# Patient Record
Sex: Female | Born: 1995
Health system: Southern US, Community
[De-identification: ages and names within clinical notes are randomized; demographics above are authoritative.]

## PROBLEM LIST (undated history)

## (undated) DIAGNOSIS — F419 Anxiety disorder, unspecified: Secondary | ICD-10-CM

## (undated) DIAGNOSIS — T7840XA Allergy, unspecified, initial encounter: Secondary | ICD-10-CM

## (undated) HISTORY — DX: Allergy, unspecified, initial encounter: T78.40XA

## (undated) HISTORY — DX: Anxiety disorder, unspecified: F41.9

---
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
## 2017-01-18 DIAGNOSIS — Z0000 Encounter for general adult medical examination without abnormal findings: Secondary | ICD-10-CM | POA: Diagnosis not present

## 2017-01-18 DIAGNOSIS — Z872 Personal history of diseases of the skin and subcutaneous tissue: Secondary | ICD-10-CM | POA: Diagnosis not present

## 2017-01-18 DIAGNOSIS — Z23 Encounter for immunization: Secondary | ICD-10-CM | POA: Diagnosis not present

## 2017-01-29 DIAGNOSIS — N390 Urinary tract infection, site not specified: Secondary | ICD-10-CM | POA: Diagnosis not present

## 2018-02-06 ENCOUNTER — Encounter: Payer: Self-pay | Admitting: Family Medicine

## 2018-02-06 ENCOUNTER — Encounter (INDEPENDENT_AMBULATORY_CARE_PROVIDER_SITE_OTHER): Payer: Self-pay

## 2018-02-06 ENCOUNTER — Ambulatory Visit (INDEPENDENT_AMBULATORY_CARE_PROVIDER_SITE_OTHER): Payer: 59 | Admitting: Family Medicine

## 2018-02-06 VITALS — BP 112/74 | HR 64 | Temp 98.6°F | Ht 63.75 in | Wt 126.5 lb

## 2018-02-06 DIAGNOSIS — Z23 Encounter for immunization: Secondary | ICD-10-CM

## 2018-02-06 DIAGNOSIS — Z0000 Encounter for general adult medical examination without abnormal findings: Secondary | ICD-10-CM

## 2018-02-06 DIAGNOSIS — N926 Irregular menstruation, unspecified: Secondary | ICD-10-CM | POA: Diagnosis not present

## 2018-02-06 DIAGNOSIS — Z113 Encounter for screening for infections with a predominantly sexual mode of transmission: Secondary | ICD-10-CM

## 2018-02-06 LAB — POCT URINE PREGNANCY: PREG TEST UR: NEGATIVE

## 2018-02-06 NOTE — Patient Instructions (Signed)

## 2018-02-06 NOTE — Progress Notes (Signed)
Subjective:    Patient ID: Nancy FellersCharlotte Louque, female    DOB: [birth year], 22 y.o.   MRN: [id number]  HPI This is a 22 yo female who presents today to establish care. Previously seen at [facility]. 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 She enjoys dancing, hanging out with friends.   Last CPE 01/18/17. Tdap- UTD Flu- most years Eye- unsure Dental- regular Exercise- regular Sleep- good, feels rested Stress- high during school year, stress relief- socializing, music Sexual- has had two partners, using condoms, has never had testing for STI Menses- irregular, 5-6 days, cramping on day 1, heavy bleeding 1-2 days    History reviewed. No pertinent past medical history. Past Surgical History:  Procedure Laterality Date  . WISDOM TOOTH EXTRACTION  2016   Family History  Problem Relation Age of Onset  . Sudden death Neg Hx   . Hypertension Neg Hx   . Hyperlipidemia Neg Hx   . Heart attack Neg Hx   . Diabetes Neg Hx    Social History   Tobacco Use  . Smoking status: Never Smoker  . Smokeless tobacco: Never Used  Substance Use Topics  . Alcohol use: Yes    Comment: Occ  . Drug use: Never     Review of Systems  Constitutional: Negative.   HENT: Negative.   Eyes: Negative.   Respiratory: Negative.   Cardiovascular: Negative.   Gastrointestinal: Negative.   Endocrine: Negative.   Genitourinary: Positive for menstrual problem (irregular).  Musculoskeletal: Negative.   Skin: Negative.   Allergic/Immunologic: Negative.   Neurological: Negative.   Hematological: Negative.        Objective:   Physical Exam Physical Exam  Constitutional: She is oriented to person, place, and time. She appears well-developed and well-nourished. No distress.  HENT:  Head: Normocephalic and atraumatic.  Right Ear: External ear normal.  Left Ear: External ear normal.  Nose: Nose normal.  Mouth/Throat: Oropharynx is clear and moist. No  oropharyngeal exudate.  Eyes: Conjunctivae are normal. Pupils are equal, round, and reactive to light.  Neck: Normal range of motion. Neck supple. No JVD present. No thyromegaly present.  Cardiovascular: Normal rate, regular rhythm, normal heart sounds and intact distal pulses.   Pulmonary/Chest: Effort normal and breath sounds normal. Musculoskeletal: Normal range of motion. She exhibits no edema or tenderness.  Lymphadenopathy:    She has no cervical adenopathy.  Neurological: She is alert and oriented to person, place, and time. She has normal reflexes.  Skin: Skin is warm and dry. She is not diaphoretic.  Psychiatric: She has a normal mood and affect. Her behavior is normal. Judgment and thought content normal.  Vitals reviewed.     BP 112/74 (BP Location: Right Arm, Patient Position: Sitting, Cuff Size: Normal)   Pulse 64   Temp 98.6 F (37 C) (Oral)   Ht 5' 3.75" (1.619 m)   Wt 126 lb 8 oz (57.4 kg)   SpO2 98%   BMI 21.88 kg/m  Wt Readings from Last 3 Encounters:  02/06/18 126 lb 8 oz (57.4 kg) (48 %, Z= -0.06)*  10/02/13 115 lb (52.2 kg) (48 %, Z= -0.04)*   * Growth percentiles are based on CDC (Girls, 2-20 Years) data.       Assessment & Plan:  1. Annual physical exam - Discussed and encouraged healthy lifestyle choices- adequate sleep, regular exercise, stress management and healthy food choices.   2. Routine screening for STI (sexually transmitted  infection) - discussed contraception options, encouraged regular condom use - HIV antibody - RPR - C. trachomatis/N. gonorrhoeae RNA  3. Need for meningococcal vaccination - reviewed NCIR, only need is for Meningitis B - Meningococcal B, OMV (Bexsero)  4. Late period - Irregular periods, negative pregnancy test confirmed before immunization given - POCT urine pregnancy   Olean Ree, FNP-BC  [facility], [facility]  02/06/2018 10:38 PM

## 2018-02-07 LAB — C. TRACHOMATIS/N. GONORRHOEAE RNA
C. trachomatis RNA, TMA: NOT DETECTED
N. gonorrhoeae RNA, TMA: NOT DETECTED

## 2018-02-07 LAB — RPR: RPR Ser Ql: NONREACTIVE

## 2018-02-07 LAB — HIV ANTIBODY (ROUTINE TESTING W REFLEX): HIV: NONREACTIVE

## 2018-03-09 ENCOUNTER — Ambulatory Visit: Payer: 59

## 2018-04-01 ENCOUNTER — Encounter: Payer: Self-pay | Admitting: Family Medicine

## 2018-04-03 ENCOUNTER — Encounter: Payer: Self-pay | Admitting: Family Medicine

## 2018-04-03 ENCOUNTER — Other Ambulatory Visit: Payer: Self-pay | Admitting: Family Medicine

## 2018-04-05 ENCOUNTER — Other Ambulatory Visit: Payer: Self-pay | Admitting: Family Medicine

## 2018-04-05 DIAGNOSIS — N938 Other specified abnormal uterine and vaginal bleeding: Secondary | ICD-10-CM

## 2018-04-05 MED ORDER — NORGESTIMATE-ETH ESTRADIOL 0.25-35 MG-MCG PO TABS: 1.0000 | ORAL_TABLET | Freq: Every day | ORAL | 4 refills | Status: DC

## 2018-05-29 DIAGNOSIS — Z23 Encounter for immunization: Secondary | ICD-10-CM | POA: Diagnosis not present

## 2018-05-31 DIAGNOSIS — H5712 Ocular pain, left eye: Secondary | ICD-10-CM | POA: Diagnosis not present

## 2018-05-31 DIAGNOSIS — H5789 Other specified disorders of eye and adnexa: Secondary | ICD-10-CM | POA: Diagnosis not present

## 2018-05-31 DIAGNOSIS — H579 Unspecified disorder of eye and adnexa: Secondary | ICD-10-CM | POA: Diagnosis not present

## 2019-04-10 ENCOUNTER — Other Ambulatory Visit: Payer: Self-pay | Admitting: Family Medicine

## 2019-04-10 DIAGNOSIS — N938 Other specified abnormal uterine and vaginal bleeding: Secondary | ICD-10-CM

## 2019-04-12 ENCOUNTER — Encounter: Payer: Self-pay | Admitting: Family Medicine

## 2019-05-06 ENCOUNTER — Other Ambulatory Visit: Payer: Self-pay | Admitting: Family Medicine

## 2019-05-06 DIAGNOSIS — N938 Other specified abnormal uterine and vaginal bleeding: Secondary | ICD-10-CM

## 2019-05-11 ENCOUNTER — Other Ambulatory Visit: Payer: Self-pay

## 2019-05-11 ENCOUNTER — Ambulatory Visit (INDEPENDENT_AMBULATORY_CARE_PROVIDER_SITE_OTHER): Payer: 59 | Admitting: Family Medicine

## 2019-05-11 ENCOUNTER — Encounter: Payer: Self-pay | Admitting: Family Medicine

## 2019-05-11 VITALS — BP 102/62 | HR 99 | Temp 98.0°F | Ht 63.75 in | Wt 134.0 lb

## 2019-05-11 DIAGNOSIS — Z0000 Encounter for general adult medical examination without abnormal findings: Secondary | ICD-10-CM | POA: Diagnosis not present

## 2019-05-11 DIAGNOSIS — Z3041 Encounter for surveillance of contraceptive pills: Secondary | ICD-10-CM | POA: Diagnosis not present

## 2019-05-11 DIAGNOSIS — Z23 Encounter for immunization: Secondary | ICD-10-CM

## 2019-05-11 MED ORDER — NORGESTIMATE-ETH ESTRADIOL 0.25-35 MG-MCG PO TABS: 1.0000 | ORAL_TABLET | Freq: Every day | ORAL | 4 refills | Status: DC

## 2019-05-11 NOTE — Progress Notes (Signed)
Subjective:    Patient ID: Nancy Fleming, female    DOB: 07-Jan-1996, 23 y.o.   MRN: [id number]  HPI Chief Complaint  Patient presents with  . Follow-up    Refill of birth control   This is a 23 yo female who presents today for annual visit and for refill of OCPs. She has been doing well. Living in [place] and doing online classes at [employer]. Same boyfriend. Supportive roommate.   Last CPE- 02/06/2018 Pap- due at age 53 Tdap- prior to starting college Flu- annual Eye- no Dental- regular Exercise- has decreased due to pandemic, some You tube exercise videos     History reviewed. No pertinent past medical history. Past Surgical History:  Procedure Laterality Date  . WISDOM TOOTH EXTRACTION  2016   Family History  Problem Relation Age of Onset  . Sudden death Neg Hx   . Hypertension Neg Hx   . Hyperlipidemia Neg Hx   . Heart attack Neg Hx   . Diabetes Neg Hx    Social History   Tobacco Use  . Smoking status: Never Smoker  . Smokeless tobacco: Never Used  Substance Use Topics  . Alcohol use: Yes    Comment: Occ  . Drug use: Never      Review of Systems  Constitutional: Negative.   HENT: Negative.   Eyes: Negative.   Respiratory: Negative.   Cardiovascular: Negative.   Gastrointestinal: Negative.   Endocrine: Negative.   Genitourinary: Negative.   Musculoskeletal: Negative.   Skin: Negative.   Allergic/Immunologic: Negative.   Neurological: Negative.   Hematological: Negative.   Psychiatric/Behavioral: Negative.        Objective:   Physical Exam Vitals signs reviewed.  Constitutional:      General: She is not in acute distress.    Appearance: Normal appearance. She is normal weight. She is not ill-appearing, toxic-appearing or diaphoretic.  HENT:     Head: Normocephalic and atraumatic.     Right Ear: Tympanic membrane, ear canal and external ear normal.     Left Ear: Tympanic membrane, ear canal and external ear normal.     Nose: Nose normal.     Mouth/Throat:     Mouth: Mucous membranes are moist.     Pharynx: Oropharynx is clear.  Eyes:     Conjunctiva/sclera: Conjunctivae normal.  Neck:     Musculoskeletal: Normal range of motion and neck supple.  Cardiovascular:     Rate and Rhythm: Normal rate and regular rhythm.     Heart sounds: Normal heart sounds.  Pulmonary:     Effort: Pulmonary effort is normal.     Breath sounds: Normal breath sounds.  Musculoskeletal: Normal range of motion.     Right lower leg: No edema.     Left lower leg: No edema.  Skin:    General: Skin is warm and dry.  Neurological:     Mental Status: She is alert.  Psychiatric:        Mood and Affect: Mood normal.        Behavior: Behavior normal.        Thought Content: Thought content normal.        Judgment: Judgment normal.       BP 102/62 (BP Location: Left Arm, Patient Position: Sitting, Cuff Size: Normal)   Pulse 99   Temp 98 F (36.7 C) (Temporal)   Ht 5' 3.75" (1.619 m)   Wt 134 lb (60.8 kg)   SpO2 99%   BMI 23.18  kg/m  Wt Readings from Last 3 Encounters:  05/11/19 134 lb (60.8 kg)  02/06/18 126 lb 8 oz (57.4 kg) (48 %, Z= -0.06)*  10/02/13 115 lb (52.2 kg) (48 %, Z= -0.04)*   * Growth percentiles are based on CDC (Girls, 2-20 Years) data.       Assessment & Plan:  1. Well woman exam without gynecological exam - Discussed and encouraged healthy lifestyle choices- adequate sleep, regular exercise, stress management and healthy food choices.   2. Encounter for surveillance of contraceptive pills - norgestimate-ethinyl estradiol (SPRINTEC 28) 0.25-35 MG-MCG tablet; Take 1 tablet by mouth daily.  Dispense: 3 Package; Refill: 4  3. Need for influenza vaccination - Flu Vaccine QUAD 6+ mos PF IM (Fluarix Quad PF)   Olean Ree, FNP-BC  [facility], [facility]  05/11/2019 4:53 PM

## 2019-05-11 NOTE — Progress Notes (Signed)
Subjective:    Patient ID: Nancy Fleming, female    DOB: 1995/08/14, 23 y.o.   MRN: [id number]  Chief Complaint  Patient presents with  . Follow-up    Refill of birth control    HPI This is 23 year old white female comes for her annual physical. She continues to take classes on line. She occasionally does exercises at home. Pt is with the same partner as last year. She does not express concerns for STDs testing  History reviewed. No pertinent past medical history.  Past Surgical History:  Procedure Laterality Date  . WISDOM TOOTH EXTRACTION  2016   Family History  Problem Relation Age of Onset  . Sudden death Neg Hx   . Hypertension Neg Hx   . Hyperlipidemia Neg Hx   . Heart attack Neg Hx   . Diabetes Neg Hx      Review of Systems  Constitutional: Negative for activity change, fatigue and fever.  HENT: Negative for congestion, ear pain, facial swelling, mouth sores, rhinorrhea, sinus pressure and sore throat.   Eyes: Negative for pain.  Respiratory: Negative for cough, chest tightness, shortness of breath and wheezing.   Cardiovascular: Negative for chest pain, palpitations and leg swelling.  Gastrointestinal: Negative for abdominal distention, abdominal pain, blood in stool, constipation, diarrhea, nausea and vomiting.  Genitourinary: Negative for difficulty urinating, flank pain, frequency and urgency.  Musculoskeletal: Negative.   Skin: Negative.   Neurological: Negative for dizziness, syncope, weakness, light-headedness, numbness and headaches.  Hematological: Negative for adenopathy.       Objective:   Physical Exam Vitals signs and nursing note reviewed.  Constitutional:      General: She is not in acute distress.    Appearance: Normal appearance. She is normal weight. She is not ill-appearing.  HENT:     Head: Normocephalic and atraumatic.     Right Ear: Tympanic membrane, ear canal and external ear normal. There is no impacted cerumen.     Left Ear:  Tympanic membrane, ear canal and external ear normal. There is no impacted cerumen.     Nose: No congestion or rhinorrhea.     Mouth/Throat:     Mouth: Mucous membranes are moist.     Pharynx: Oropharynx is clear. No oropharyngeal exudate or posterior oropharyngeal erythema.  Eyes:     General: No scleral icterus.       Right eye: No discharge.        Left eye: No discharge.     Extraocular Movements: Extraocular movements intact.     Conjunctiva/sclera: Conjunctivae normal.     Pupils: Pupils are equal, round, and reactive to light.  Neck:     Musculoskeletal: Normal range of motion and neck supple.  Cardiovascular:     Rate and Rhythm: Normal rate and regular rhythm.     Pulses: Normal pulses.     Heart sounds: Normal heart sounds.  Pulmonary:     Effort: Pulmonary effort is normal.     Breath sounds: Normal breath sounds. No wheezing, rhonchi or rales.  Abdominal:     General: Bowel sounds are normal. There is no distension.     Palpations: Abdomen is soft.     Tenderness: There is no abdominal tenderness.  Musculoskeletal:     Right lower leg: No edema.     Left lower leg: No edema.  Skin:    General: Skin is warm and dry.     Findings: No rash.     Comments: Hx  of eczema  Neurological:     General: No focal deficit present.     Mental Status: She is alert and oriented to person, place, and time. Mental status is at baseline.     Motor: No weakness.  Psychiatric:        Mood and Affect: Mood normal.        Behavior: Behavior normal.        Thought Content: Thought content normal.        Judgment: Judgment normal.        Assessment & Plan:  1. Dysfunctional uterine bleeding Stable condition, continue with current prescription  - norgestimate-ethinyl estradiol (SPRINTEC 28) 0.25-35 MG-MCG tablet; Take 1 tablet by mouth daily.  Dispense: 3 Package; Refill: 4

## 2019-05-11 NOTE — Patient Instructions (Signed)
Health Maintenance, Female Adopting a healthy lifestyle and getting preventive care are important in promoting health and wellness. Ask your health care provider about:  The right schedule for you to have regular tests and exams.  Things you can do on your own to prevent diseases and keep yourself healthy. What should I know about diet, weight, and exercise? Eat a healthy diet   Eat a diet that includes plenty of vegetables, fruits, low-fat dairy products, and lean protein.  Do not eat a lot of foods that are high in solid fats, added sugars, or sodium. Maintain a healthy weight Body mass index (BMI) is used to identify weight problems. It estimates body fat based on height and weight. Your health care provider can help determine your BMI and help you achieve or maintain a healthy weight. Get regular exercise Get regular exercise. This is one of the most important things you can do for your health. Most adults should:  Exercise for at least 150 minutes each week. The exercise should increase your heart rate and make you sweat (moderate-intensity exercise).  Do strengthening exercises at least twice a week. This is in addition to the moderate-intensity exercise.  Spend less time sitting. Even light physical activity can be beneficial. Watch cholesterol and blood lipids Have your blood tested for lipids and cholesterol at 20 years of age, then have this test every 5 years. Have your cholesterol levels checked more often if:  Your lipid or cholesterol levels are high.  You are older than 21 years of age.  You are at high risk for heart disease. What should I know about cancer screening? Depending on your health history and family history, you may need to have cancer screening at various ages. This may include screening for:  Breast cancer.  Cervical cancer.  Colorectal cancer.  Skin cancer.  Lung cancer. What should I know about heart disease, diabetes, and high blood  pressure? Blood pressure and heart disease  High blood pressure causes heart disease and increases the risk of stroke. This is more likely to develop in people who have high blood pressure readings, are of African descent, or are overweight.  Have your blood pressure checked: ? Every 3-5 years if you are 18-39 years of age. ? Every year if you are 40 years old or older. Diabetes Have regular diabetes screenings. This checks your fasting blood sugar level. Have the screening done:  Once every three years after age 40 if you are at a normal weight and have a low risk for diabetes.  More often and at a younger age if you are overweight or have a high risk for diabetes. What should I know about preventing infection? Hepatitis B If you have a higher risk for hepatitis B, you should be screened for this virus. Talk with your health care provider to find out if you are at risk for hepatitis B infection. Hepatitis C Testing is recommended for:  Everyone born from 1945 through 1965.  Anyone with known risk factors for hepatitis C. Sexually transmitted infections (STIs)  Get screened for STIs, including gonorrhea and chlamydia, if: ? You are sexually active and are younger than 21 years of age. ? You are older than 21 years of age and your health care provider tells you that you are at risk for this type of infection. ? Your sexual activity has changed since you were last screened, and you are at increased risk for chlamydia or gonorrhea. Ask your health care provider if   you are at risk.  Ask your health care provider about whether you are at high risk for HIV. Your health care provider may recommend a prescription medicine to help prevent HIV infection. If you choose to take medicine to prevent HIV, you should first get tested for HIV. You should then be tested every 3 months for as long as you are taking the medicine. Pregnancy  If you are about to stop having your period (premenopausal) and  you may become pregnant, seek counseling before you get pregnant.  Take 400 to 800 micrograms (mcg) of folic acid every day if you become pregnant.  Ask for birth control (contraception) if you want to prevent pregnancy. Osteoporosis and menopause Osteoporosis is a disease in which the bones lose minerals and strength with aging. This can result in bone fractures. If you are 65 years old or older, or if you are at risk for osteoporosis and fractures, ask your health care provider if you should:  Be screened for bone loss.  Take a calcium or vitamin D supplement to lower your risk of fractures.  Be given hormone replacement therapy (HRT) to treat symptoms of menopause. Follow these instructions at home: Lifestyle  Do not use any products that contain nicotine or tobacco, such as cigarettes, e-cigarettes, and chewing tobacco. If you need help quitting, ask your health care provider.  Do not use street drugs.  Do not share needles.  Ask your health care provider for help if you need support or information about quitting drugs. Alcohol use  Do not drink alcohol if: ? Your health care provider tells you not to drink. ? You are pregnant, may be pregnant, or are planning to become pregnant.  If you drink alcohol: ? Limit how much you use to 0-1 drink a day. ? Limit intake if you are breastfeeding.  Be aware of how much alcohol is in your drink. In the [place] one drink equals one 12 oz bottle of beer (355 mL), one 5 oz glass of wine (148 mL), or one 1 oz glass of hard liquor (44 mL). General instructions  Schedule regular health, dental, and eye exams.  Stay current with your vaccines.  Tell your health care provider if: ? You often feel depressed. ? You have ever been abused or do not feel safe at home. Summary  Adopting a healthy lifestyle and getting preventive care are important in promoting health and wellness.  Follow your health care provider's instructions about healthy  diet, exercising, and getting tested or screened for diseases.  Follow your health care provider's instructions on monitoring your cholesterol and blood pressure. This information is not intended to replace advice given to you by your health care provider. Make sure you discuss any questions you have with your health care provider. Document Released: 01/25/2011 Document Revised: 07/05/2018 Document Reviewed: 07/05/2018 Elsevier Patient Education  2020 [employer]  

## 2020-05-14 ENCOUNTER — Encounter: Payer: 59 | Admitting: Family Medicine

## 2020-06-09 ENCOUNTER — Other Ambulatory Visit (HOSPITAL_COMMUNITY)
Admission: RE | Admit: 2020-06-09 | Discharge: 2020-06-09 | Disposition: A | Discharge status: Home / Self Care | Payer: 59 | Source: Ambulatory Visit | Attending: Family Medicine | Admitting: Family Medicine

## 2020-06-09 ENCOUNTER — Ambulatory Visit (INDEPENDENT_AMBULATORY_CARE_PROVIDER_SITE_OTHER): Payer: 59 | Admitting: Family Medicine

## 2020-06-09 ENCOUNTER — Encounter: Payer: Self-pay | Admitting: Family Medicine

## 2020-06-09 ENCOUNTER — Other Ambulatory Visit: Payer: Self-pay

## 2020-06-09 VITALS — BP 110/64 | HR 80 | Temp 97.7°F | Ht 63.5 in | Wt 128.0 lb

## 2020-06-09 DIAGNOSIS — Z23 Encounter for immunization: Secondary | ICD-10-CM | POA: Diagnosis not present

## 2020-06-09 DIAGNOSIS — Z124 Encounter for screening for malignant neoplasm of cervix: Secondary | ICD-10-CM

## 2020-06-09 DIAGNOSIS — Z0000 Encounter for general adult medical examination without abnormal findings: Secondary | ICD-10-CM

## 2020-06-09 DIAGNOSIS — Z3041 Encounter for surveillance of contraceptive pills: Secondary | ICD-10-CM | POA: Diagnosis not present

## 2020-06-09 MED ORDER — NORGESTIMATE-ETH ESTRADIOL 0.25-35 MG-MCG PO TABS: 1.0000 | ORAL_TABLET | Freq: Every day | ORAL | 4 refills | Status: DC

## 2020-06-09 NOTE — Patient Instructions (Addendum)
Good to see you today, good luck with finishing up school!   Health Maintenance, Female Adopting a healthy lifestyle and getting preventive care are important in promoting health and wellness. Ask your health care provider about:  The right schedule for you to have regular tests and exams.  Things you can do on your own to prevent diseases and keep yourself healthy. What should I know about diet, weight, and exercise? Eat a healthy diet   Eat a diet that includes plenty of vegetables, fruits, low-fat dairy products, and lean protein.  Do not eat a lot of foods that are high in solid fats, added sugars, or sodium. Maintain a healthy weight Body mass index (BMI) is used to identify weight problems. It estimates body fat based on height and weight. Your health care provider can help determine your BMI and help you achieve or maintain a healthy weight. Get regular exercise Get regular exercise. This is one of the most important things you can do for your health. Most adults should:  Exercise for at least 150 minutes each week. The exercise should increase your heart rate and make you sweat (moderate-intensity exercise).  Do strengthening exercises at least twice a week. This is in addition to the moderate-intensity exercise.  Spend less time sitting. Even light physical activity can be beneficial. Watch cholesterol and blood lipids Have your blood tested for lipids and cholesterol at 22 years of age, then have this test every 5 years. Have your cholesterol levels checked more often if:  Your lipid or cholesterol levels are high.  You are older than 22 years of age.  You are at high risk for heart disease. What should I know about cancer screening? Depending on your health history and family history, you may need to have cancer screening at various ages. This may include screening for:  Breast cancer.  Cervical cancer.  Colorectal cancer.  Skin cancer.  Lung cancer. What  should I know about heart disease, diabetes, and high blood pressure? Blood pressure and heart disease  High blood pressure causes heart disease and increases the risk of stroke. This is more likely to develop in people who have high blood pressure readings, are of African descent, or are overweight.  Have your blood pressure checked: ? Every 3-5 years if you are 42-13 years of age. ? Every year if you are 41 years old or older. Diabetes Have regular diabetes screenings. This checks your fasting blood sugar level. Have the screening done:  Once every three years after age 83 if you are at a normal weight and have a low risk for diabetes.  More often and at a younger age if you are overweight or have a high risk for diabetes. What should I know about preventing infection? Hepatitis B If you have a higher risk for hepatitis B, you should be screened for this virus. Talk with your health care provider to find out if you are at risk for hepatitis B infection. Hepatitis C Testing is recommended for:  Everyone born from [birth year] through 1963.  Anyone with known risk factors for hepatitis C. Sexually transmitted infections (STIs)  Get screened for STIs, including gonorrhea and chlamydia, if: ? You are sexually active and are younger than 22 years of age. ? You are older than 22 years of age and your health care provider tells you that you are at risk for this type of infection. ? Your sexual activity has changed since you were last screened, and you are  at increased risk for chlamydia or gonorrhea. Ask your health care provider if you are at risk.  Ask your health care provider about whether you are at high risk for HIV. Your health care provider may recommend a prescription medicine to help prevent HIV infection. If you choose to take medicine to prevent HIV, you should first get tested for HIV. You should then be tested every 3 months for as long as you are taking the medicine. Pregnancy  If  you are about to stop having your period (premenopausal) and you may become pregnant, seek counseling before you get pregnant.  Take 400 to 800 micrograms (mcg) of folic acid every day if you become pregnant.  Ask for birth control (contraception) if you want to prevent pregnancy. Osteoporosis and menopause Osteoporosis is a disease in which the bones lose minerals and strength with aging. This can result in bone fractures. If you are 66 years old or older, or if you are at risk for osteoporosis and fractures, ask your health care provider if you should:  Be screened for bone loss.  Take a calcium or vitamin D supplement to lower your risk of fractures.  Be given hormone replacement therapy (HRT) to treat symptoms of menopause. Follow these instructions at home: Lifestyle  Do not use any products that contain nicotine or tobacco, such as cigarettes, e-cigarettes, and chewing tobacco. If you need help quitting, ask your health care provider.  Do not use street drugs.  Do not share needles.  Ask your health care provider for help if you need support or information about quitting drugs. Alcohol use  Do not drink alcohol if: ? Your health care provider tells you not to drink. ? You are pregnant, may be pregnant, or are planning to become pregnant.  If you drink alcohol: ? Limit how much you use to 0-1 drink a day. ? Limit intake if you are breastfeeding.  Be aware of how much alcohol is in your drink. In the [place] one drink equals one 12 oz bottle of beer (355 mL), one 5 oz glass of wine (148 mL), or one 1 oz glass of hard liquor (44 mL). General instructions  Schedule regular health, dental, and eye exams.  Stay current with your vaccines.  Tell your health care provider if: ? You often feel depressed. ? You have ever been abused or do not feel safe at home. Summary  Adopting a healthy lifestyle and getting preventive care are important in promoting health and  wellness.  Follow your health care provider's instructions about healthy diet, exercising, and getting tested or screened for diseases.  Follow your health care provider's instructions on monitoring your cholesterol and blood pressure. This information is not intended to replace advice given to you by your health care provider. Make sure you discuss any questions you have with your health care provider. Document Revised: 07/05/2018 Document Reviewed: 07/05/2018 Elsevier Patient Education  2020 [employer].    https://www.cdc.gov/vaccines/hcp/vis/vis-statements/tdap.pdf">  Tdap (Tetanus, Diphtheria, Pertussis) Vaccine: What You Need to Know 1. Why get vaccinated? Tdap vaccine can prevent tetanus, diphtheria, and pertussis. Diphtheria and pertussis spread from person to person. Tetanus enters the body through cuts or wounds.  TETANUS (T) causes painful stiffening of the muscles. Tetanus can lead to serious health problems, including being unable to open the mouth, having trouble swallowing and breathing, or death.  DIPHTHERIA (D) can lead to difficulty breathing, heart failure, paralysis, or death.  PERTUSSIS (aP), also known as "whooping cough," can cause  uncontrollable, violent coughing which makes it hard to breathe, eat, or drink. Pertussis can be extremely serious in babies and young children, causing pneumonia, convulsions, brain damage, or death. In teens and adults, it can cause weight loss, loss of bladder control, passing out, and rib fractures from severe coughing. 2. Tdap vaccine Tdap is only for children 7 years and older, adolescents, and adults.  Adolescents should receive a single dose of Tdap, preferably at age 22 or 12 years. Pregnant women should get a dose of Tdap during every pregnancy, to protect the newborn from pertussis. Infants are most at risk for severe, life-threatening complications from pertussis. Adults who have never received Tdap should get a dose of  Tdap. Also, adults should receive a booster dose every 10 years, or earlier in the case of a severe and dirty wound or burn. Booster doses can be either Tdap or Td (a different vaccine that protects against tetanus and diphtheria but not pertussis). Tdap may be given at the same time as other vaccines. 3. Talk with your health care provider Tell your vaccine provider if the person getting the vaccine:  Has had an allergic reaction after a previous dose of any vaccine that protects against tetanus, diphtheria, or pertussis, or has any severe, life-threatening allergies.  Has had a coma, decreased level of consciousness, or prolonged seizures within 7 days after a previous dose of any pertussis vaccine (DTP, DTaP, or Tdap).  Has seizures or another nervous system problem.  Has ever had Guillain-Barr Syndrome (also called GBS).  Has had severe pain or swelling after a previous dose of any vaccine that protects against tetanus or diphtheria. In some cases, your health care provider may decide to postpone Tdap vaccination to a future visit.  People with minor illnesses, such as a cold, may be vaccinated. People who are moderately or severely ill should usually wait until they recover before getting Tdap vaccine.  Your health care provider can give you more information. 4. Risks of a vaccine reaction  Pain, redness, or swelling where the shot was given, mild fever, headache, feeling tired, and nausea, vomiting, diarrhea, or stomachache sometimes happen after Tdap vaccine. People sometimes faint after medical procedures, including vaccination. Tell your provider if you feel dizzy or have vision changes or ringing in the ears.  As with any medicine, there is a very remote chance of a vaccine causing a severe allergic reaction, other serious injury, or death. 5. What if there is a serious problem? An allergic reaction could occur after the vaccinated person leaves the clinic. If you see signs of a  severe allergic reaction (hives, swelling of the face and throat, difficulty breathing, a fast heartbeat, dizziness, or weakness), call 9-1-1 and get the person to the nearest hospital. For other signs that concern you, call your health care provider.  Adverse reactions should be reported to the Vaccine Adverse Event Reporting System (VAERS). Your health care provider will usually file this report, or you can do it yourself. Visit the VAERS website at www.vaers.LAgents.nohhs.gov or call (339) 156-53191-613-474-5512. VAERS is only for reporting reactions, and VAERS staff do not give medical advice. 6. The National Vaccine Injury Compensation Program The [employer] Vaccine Injury Compensation Program (VICP) is a federal program that was created to compensate people who may have been injured by certain vaccines. Visit the VICP website at SpiritualWord.atwww.hrsa.gov/vaccinecompensation or call (458) 525-44821-321-843-6535 to learn about the program and about filing a claim. There is a time limit to file a claim for compensation. 7.  How can I learn more?  Ask your health care provider.  Call your local or state health department.  Contact the Centers for Disease Control and Prevention (CDC): ? Call 904 333 7558 (1-800-CDC-INFO) or ? Visit CDC's website at PicCapture.uy Vaccine Information Statement Tdap (Tetanus, Diphtheria, Pertussis) Vaccine (10/25/2018) This information is not intended to replace advice given to you by your health care provider. Make sure you discuss any questions you have with your health care provider. Document Revised: 11/03/2018 Document Reviewed: 11/06/2018 Elsevier Patient Education  2020 [employer].

## 2020-06-09 NOTE — Progress Notes (Signed)
Subjective:    Patient ID: Nancy Fleming, female    DOB: [birth year], 24 y.o.   MRN: [id number]  HPI Chief Complaint  Patient presents with  . Annual Exam   She is a Holiday representative at [employer], Airline pilot.  She is enjoying it but it is stressful.  She will graduate in May.  Last CPE- last year Pap-has turned 23 in the last year, will have her Pap today Tdap-overdue, will have today Flu-has already had Covid 19 vaccine- fully vaccinated Dental-regular Exercise-has been going to the gym several times a week Sexual- still with same boyfriend, monogamous    Review of Systems  Constitutional: Negative.   HENT: Negative.   Eyes: Negative.   Respiratory: Negative.   Cardiovascular: Negative.   Gastrointestinal: Negative.   Endocrine: Negative.   Genitourinary: Negative.   Musculoskeletal: Negative.   Skin: Negative.   Allergic/Immunologic: Negative.   Neurological: Negative.   Hematological: Negative.   Psychiatric/Behavioral: Negative.        Objective:   Physical Exam Physical Exam  Constitutional: She is oriented to person, place, and time. She appears well-developed and well-nourished. No distress.  HENT:  Head: Normocephalic and atraumatic.  Right Ear: External ear normal. TM normal.  Left Ear: External ear normal. TM normal.  Nose: Nose normal.  Mouth/Throat: Oropharynx is clear and moist. No oropharyngeal exudate.  Eyes: Conjunctivae are normal.   Neck: Normal range of motion. Neck supple. No JVD present. No thyromegaly present.  Cardiovascular: Normal rate, regular rhythm, normal heart sounds and intact distal pulses.   Pulmonary/Chest: Effort normal and breath sounds normal. Right breast exhibits no inverted nipple, no mass, no nipple discharge, no skin change and no tenderness. Left breast exhibits no inverted nipple, no mass, no nipple discharge, no skin change and no tenderness. Breasts are symmetrical.  Abdominal: Soft. Bowel sounds are normal. She exhibits  no distension and no mass. There is no tenderness. There is no rebound and no guarding.  Genitourinary: Vagina normal. Pelvic exam was performed with patient supine. There is no rash, tenderness, lesion or injury on the right labia. There is no rash, tenderness, lesion or injury on the left labia. Cervix exhibits no motion tenderness and no discharge. No vaginal discharge found.  Musculoskeletal: Normal range of motion. She exhibits no edema or tenderness.  Lymphadenopathy:    She has no cervical adenopathy.  Neurological: She is alert and oriented to person, place, and time.   Skin: Skin is warm and dry. She is not diaphoretic.  Psychiatric: She has a normal mood and affect. Her behavior is normal. Judgment and thought content normal.  Vitals reviewed.  BP 110/64   Pulse 80   Temp 97.7 F (36.5 C) (Temporal)   Ht 5' 3.5" (1.613 m)   Wt 128 lb (58.1 kg)   LMP 06/04/2020   SpO2 99%   BMI 22.32 kg/m  Wt Readings from Last 3 Encounters:  06/09/20 128 lb (58.1 kg)  05/11/19 134 lb (60.8 kg)  02/06/18 126 lb 8 oz (57.4 kg) (48 %, Z= -0.06)*   * Growth percentiles are based on CDC (Girls, 2-20 Years) data.   Depression screen [facility] 2/9 06/09/2020 02/06/2018  Decreased Interest 0 0  Down, Depressed, Hopeless 0 0  PHQ - 2 Score 0 0  Altered sleeping 0 -  Tired, decreased energy 0 -  Change in appetite 0 -  Feeling bad or failure about yourself  0 -  Trouble concentrating 0 -  Moving slowly or  fidgety/restless 0 -  Suicidal thoughts 0 -  PHQ-9 Score 0 -  Difficult doing work/chores Not difficult at all -       Assessment & Plan:  1. Annual physical exam - Discussed and encouraged healthy lifestyle choices- adequate sleep, regular exercise, stress management and healthy food choices.    2. Encounter for surveillance of contraceptive pills - norgestimate-ethinyl estradiol (SPRINTEC 28) 0.25-35 MG-MCG tablet; Take 1 tablet by mouth daily.  Dispense: 84 tablet; Refill: 4  3.  Screening for cervical cancer - Cytology - PAP([place])  This visit occurred during the SARS-CoV-2 public health emergency.  Safety protocols were in place, including screening questions prior to the visit, additional usage of staff PPE, and extensive cleaning of exam room while observing appropriate contact time as indicated for disinfecting solutions.    Olean Ree, FNP-BC  [facility], [facility]  06/09/2020 5:02 PM

## 2020-06-11 LAB — CYTOLOGY - PAP: Diagnosis: NEGATIVE

## 2020-07-05 ENCOUNTER — Other Ambulatory Visit: Payer: Self-pay | Admitting: Family Medicine

## 2020-07-05 DIAGNOSIS — Z3041 Encounter for surveillance of contraceptive pills: Secondary | ICD-10-CM

## 2021-01-14 ENCOUNTER — Ambulatory Visit (INDEPENDENT_AMBULATORY_CARE_PROVIDER_SITE_OTHER): Payer: 59 | Admitting: Internal Medicine

## 2021-01-14 ENCOUNTER — Encounter: Payer: Self-pay | Admitting: Internal Medicine

## 2021-01-14 ENCOUNTER — Other Ambulatory Visit: Payer: Self-pay

## 2021-01-14 DIAGNOSIS — R0981 Nasal congestion: Secondary | ICD-10-CM

## 2021-01-14 NOTE — Progress Notes (Signed)
Subjective:    Patient ID: Nancy Fleming, female    DOB: 06/18/1996, 25 y.o.   MRN: [id number]  HPI Here due to recurrent sinus symptoms This visit occurred during the SARS-CoV-2 public health emergency.  Safety protocols were in place, including screening questions prior to the visit, additional usage of staff PPE, and extensive cleaning of exam room while observing appropriate contact time as indicated for disinfecting solutions.   Started in March---lasted 1-2 weeks Got prednisone on virtual visit--helped briefly Flonase --stopped when symptoms improved  Recurred a month later Did another virtual visit and got the same Rx  Just graduated from [employer]--elementary education  Recurred again Sinus drainage and congestion Blowing nose a lot Some ear pain--and popping Frontal headaches Not much cough  Will take ibuprofen or dayquil Helps some  Current Outpatient Medications on File Prior to Visit  Medication Sig Dispense Refill   SPRINTEC 28 0.25-35 MG-MCG tablet TAKE 1 TABLET BY MOUTH EVERY DAY 84 tablet 2   triamcinolone (KENALOG) 0.025 % cream Apply 1 application topically as needed.     No current facility-administered medications on file prior to visit.    Allergies  Allergen Reactions   Cefdinir Hives   Penicillins     History reviewed. No pertinent past medical history.  Past Surgical History:  Procedure Laterality Date   WISDOM TOOTH EXTRACTION  2016    Family History  Problem Relation Age of Onset   Sudden death Neg Hx    Hypertension Neg Hx    Hyperlipidemia Neg Hx    Heart attack Neg Hx    Diabetes Neg Hx     Social History   Socioeconomic History   Marital status: Single    Spouse name: Not on file   Number of children: Not on file   Years of education: Not on file   Highest education level: Not on file  Occupational History   Not on file  Tobacco Use   Smoking status: Never   Smokeless tobacco: Never  Substance and Sexual Activity    Alcohol use: Yes    Comment: Occ   Drug use: Never   Sexual activity: Yes    Partners: Male    Birth control/protection: Condom  Other Topics Concern   Not on file  Social History Narrative   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[place], considering studying early childhood ed   Enjoys music, getting together with friends, dancing   Social Determinants of Health   Financial Resource Strain: Not on file  Food Insecurity: Not on file  Transportation Needs: Not on file  Physical Activity: Not on file  Stress: Not on file  Social Connections: Not on file  Intimate Partner Violence: Not on file   Review of Systems No fever No SOB---just nasal congestion Did test both times before for COVID---negative Never noted allergy symptoms in the past--or just mild    Objective:   Physical Exam Constitutional:      Appearance: Normal appearance.  HENT:     Right Ear: Tympanic membrane and ear canal normal.     Left Ear: Tympanic membrane and ear canal normal.     Nose:     Comments: Moderate inflammation No obvious polyps    Mouth/Throat:     Pharynx: No oropharyngeal exudate or posterior oropharyngeal erythema.  Pulmonary:     Effort: Pulmonary effort is normal.     Breath sounds: Normal breath sounds. No wheezing or rales.  Musculoskeletal:     Cervical back:  Neck supple.  Lymphadenopathy:     Cervical: No cervical adenopathy.  Neurological:     Mental Status: She is alert.           Assessment & Plan:

## 2021-01-14 NOTE — Patient Instructions (Signed)
Please take the flonase and zyrtec daily for now. If you are not better by next week, send an email and I will prescribe an antibiotic There are other potential treatments if this persists

## 2021-01-14 NOTE — Assessment & Plan Note (Signed)
Has had recurrent spells of sinus symptoms Hard to tell if allergic or infectious No new exposures and at most mild symptoms in past (mostly to cats)  Discussed restarting the flonase Take cetirizine daily Consider montelukast If feels more sick next week, will Rx doxy (and use back up contraception)

## 2021-07-15 ENCOUNTER — Ambulatory Visit: Payer: Self-pay | Admitting: Adult Health

## 2021-08-10 ENCOUNTER — Ambulatory Visit: Payer: Self-pay | Admitting: Adult Health

## 2021-08-10 ENCOUNTER — Ambulatory Visit: Payer: BC Managed Care – PPO | Admitting: Adult Health

## 2021-08-28 ENCOUNTER — Other Ambulatory Visit: Payer: Self-pay | Admitting: Family Medicine

## 2021-08-28 DIAGNOSIS — Z3041 Encounter for surveillance of contraceptive pills: Secondary | ICD-10-CM

## 2021-08-28 NOTE — Telephone Encounter (Signed)
Name of [place] 25  0.25-35 mg/mcg  Name of Pharmacy:[facility] or Written Date and Quantity: # 19 x 2 on 07/25/2020 Last Office Visit and Type: 01/14/21 acute  and  06/09/2020 annual Next Office Visit and Type: 10/01/21 pt has NP appt at [facility]  Sending refill request to [place]

## 2021-08-28 NOTE — Telephone Encounter (Signed)
°  Encourage patient to contact the pharmacy for refills or they can request refills through [facility]  LAST APPOINTMENT DATE:  Please schedule appointment if longer than 1 year  NEXT APPOINTMENT DATE:  MEDICATION:SPRINTEC 28 0.25-35 MG-MCG tablet  Is the patient out of medication?   [id number], [place] - [address]  Let patient know to contact pharmacy at the end of the day to make sure medication is ready.  Please notify patient to allow 48-72 hours to process  CLINICAL FILLS OUT ALL BELOW:   LAST REFILL:  QTY:  REFILL DATE:    OTHER COMMENTS:    Okay for refill?  Please advise

## 2021-08-31 ENCOUNTER — Telehealth: Payer: Self-pay | Admitting: Family Medicine

## 2021-08-31 DIAGNOSIS — Z3041 Encounter for surveillance of contraceptive pills: Secondary | ICD-10-CM

## 2021-08-31 MED ORDER — NORGESTIMATE-ETH ESTRADIOL 0.25-35 MG-MCG PO TABS: 1.0000 | ORAL_TABLET | Freq: Every day | ORAL | 0 refills | Status: DC

## 2021-08-31 NOTE — Telephone Encounter (Signed)
°  Encourage patient to contact the pharmacy for refills or they can request refills through [facility]  LAST APPOINTMENT DATE:  Please schedule appointment if longer than 1 year  NEXT APPOINTMENT DATE:  MEDICATION:SPRINTEC 28 0.25-35 MG-MCG tablet  Is the patient out of medication?   [facility] [place]  Let patient know to contact pharmacy at the end of the day to make sure medication is ready.  Please notify patient to allow 48-72 hours to process  CLINICAL FILLS OUT ALL BELOW:   LAST REFILL:  QTY:  REFILL DATE:    OTHER COMMENTS:    Okay for refill?  Please advise

## 2021-09-01 NOTE — Telephone Encounter (Signed)
Refill for sprintec 28 already refilled on 08/31/21.

## 2021-09-01 NOTE — Telephone Encounter (Signed)
Left v/m for pt to ck with pharmacy about refill request.

## 2021-09-01 NOTE — Addendum Note (Signed)
Addended by: Patience Musca on: 09/01/2021 09:37 AM   Modules accepted: Orders

## 2021-09-08 ENCOUNTER — Other Ambulatory Visit: Payer: Self-pay | Admitting: *Deleted

## 2021-09-08 DIAGNOSIS — Z3041 Encounter for surveillance of contraceptive pills: Secondary | ICD-10-CM

## 2021-10-01 ENCOUNTER — Ambulatory Visit: Payer: Self-pay | Admitting: Adult Health

## 2021-10-28 ENCOUNTER — Emergency Department
Admission: EM | Admit: 2021-10-28 | Discharge: 2021-10-28 | Disposition: A | Discharge status: Home / Self Care | Payer: BC Managed Care – PPO | Attending: Emergency Medicine | Admitting: Emergency Medicine

## 2021-10-28 ENCOUNTER — Emergency Department: Payer: BC Managed Care – PPO

## 2021-10-28 ENCOUNTER — Other Ambulatory Visit: Payer: Self-pay

## 2021-10-28 ENCOUNTER — Encounter: Payer: Self-pay | Admitting: Emergency Medicine

## 2021-10-28 DIAGNOSIS — R109 Unspecified abdominal pain: Secondary | ICD-10-CM | POA: Diagnosis present

## 2021-10-28 DIAGNOSIS — R1013 Epigastric pain: Secondary | ICD-10-CM | POA: Insufficient documentation

## 2021-10-28 DIAGNOSIS — R110 Nausea: Secondary | ICD-10-CM | POA: Insufficient documentation

## 2021-10-28 LAB — COMPREHENSIVE METABOLIC PANEL
ALT: 23 U/L (ref 0–44)
AST: 26 U/L (ref 15–41)
Albumin: 4 g/dL (ref 3.5–5.0)
Alkaline Phosphatase: 32 U/L — ABNORMAL LOW (ref 38–126)
Anion gap: 8 (ref 5–15)
BUN: 15 mg/dL (ref 6–20)
CO2: 24 mmol/L (ref 22–32)
Calcium: 9 mg/dL (ref 8.9–10.3)
Chloride: 106 mmol/L (ref 98–111)
Creatinine, Ser: 0.71 mg/dL (ref 0.44–1.00)
GFR, Estimated: >60 mL/min (ref >60)
Glucose, Bld: 108 mg/dL — ABNORMAL HIGH (ref 70–99)
Potassium: 4.2 mmol/L (ref 3.5–5.1)
Sodium: 138 mmol/L (ref 135–145)
Total Bilirubin: 0.7 mg/dL (ref 0.3–1.2)
Total Protein: 6.8 g/dL (ref 6.5–8.1)

## 2021-10-28 LAB — CBC
HCT: 40.3 % (ref 36.0–46.0)
Hemoglobin: 13.6 g/dL (ref 12.0–15.0)
MCH: 30.7 pg (ref 26.0–34.0)
MCHC: 33.7 g/dL (ref 30.0–36.0)
MCV: 91 fL (ref 80.0–100.0)
Platelets: 299 10*3/uL (ref 150–400)
RBC: 4.43 MIL/uL (ref 3.87–5.11)
RDW: 11.9 % (ref 11.5–15.5)
WBC: 9.4 10*3/uL (ref 4.0–10.5)
nRBC: 0 % (ref 0.0–0.2)

## 2021-10-28 LAB — PREGNANCY, URINE: Preg Test, Ur: NEGATIVE

## 2021-10-28 LAB — LIPASE, BLOOD: Lipase: 32 U/L (ref 11–51)

## 2021-10-28 LAB — URINALYSIS, ROUTINE W REFLEX MICROSCOPIC
Bilirubin Urine: NEGATIVE
Glucose, UA: NEGATIVE mg/dL
Hgb urine dipstick: NEGATIVE
Ketones, ur: NEGATIVE mg/dL
Leukocytes,Ua: NEGATIVE
Nitrite: NEGATIVE
Protein, ur: NEGATIVE mg/dL
Specific Gravity, Urine: 1.004 — ABNORMAL LOW (ref 1.005–1.030)
pH: 6 (ref 5.0–8.0)

## 2021-10-28 MED ORDER — FAMOTIDINE 20 MG PO TABS: 20.0000 mg | ORAL_TABLET | Freq: Every day | ORAL | 1 refills | Status: DC

## 2021-10-28 NOTE — ED Triage Notes (Signed)
Pt comes into the ED via POV c/o upper abd pain and back pain that started on Sunday.  Pt explains the pain is intermittent. Pt has nausea when the pain is there.  Denies any diarrhea.  Pt in NAD at this time with even and unlabored respirations.  ?

## 2021-10-28 NOTE — ED Provider Notes (Signed)
? ?[facility] ?Provider Note ? ? ? Event Date/Time  ? First MD Initiated Contact with Patient 10/28/21 1717   ?  (approximate) ? ? ?History  ? ?Abdominal Pain and Back Pain ? ? ?HPI ? ?Nancy Fleming is a 26 y.o. female  who, per [facility] note dated from two days ago was seen for left sided abdominal pain, who presents to the emergency department today because of concern for abdominal pain. The patient states that the pain started 3 days ago. It is located in the epigastric region. It does radiate to her back. Has been accompanied by nausea.  She has noticed that the pain will start about an hour to an hour and a half after eating.  Patient denies any change in urination or defecation.  Denies similar pain in the past.  Patient states her mother had her gallbladder removed. ? ?Physical Exam  ? ?Triage Vital Signs: ?ED Triage Vitals  ?Enc Vitals Group  ?   BP 10/28/21 1612 140/81  ?   Pulse Rate 10/28/21 1612 86  ?   Resp 10/28/21 1612 18  ?   Temp 10/28/21 1612 98 ?F (36.7 ?C)  ?   Temp Source 10/28/21 1612 Oral  ?   SpO2 10/28/21 1612 100 %  ?   Weight 10/28/21 1605 125 lb (56.7 kg)  ?   Height 10/28/21 1605 5\' 3"  (1.6 m)  ?   Head Circumference --   ?   Peak Flow --   ?   Pain Score 10/28/21 1605 8  ? ?Most recent vital signs: ?Vitals:  ? 10/28/21 1612  ?BP: 140/81  ?Pulse: 86  ?Resp: 18  ?Temp: 98 ?F (36.7 ?C)  ?SpO2: 100%  ? ? ?General: Awake, no distress.  ?CV:  Good peripheral perfusion. Regular rate and rhythm. ?Resp:  Normal effort. Lungs clear to auscultation ?Abd:  No distention. Non tender.  ? ? ? ?ED Results / Procedures / Treatments  ? ?Labs ?(all labs ordered are listed, but only abnormal results are displayed) ?Labs Reviewed  ?COMPREHENSIVE METABOLIC PANEL - Abnormal; Notable for the following components:  ?    Result Value  ? Glucose, Bld 108 (*)   ? Alkaline Phosphatase 32 (*)   ? All other components within normal limits  ?URINALYSIS, ROUTINE W REFLEX MICROSCOPIC -  Abnormal; Notable for the following components:  ? Color, Urine STRAW (*)   ? APPearance CLEAR (*)   ? Specific Gravity, Urine 1.004 (*)   ? All other components within normal limits  ?LIPASE, BLOOD  ?CBC  ?POC URINE PREG, ED  ? ? ? ?EKG ? ?None ? ? ?RADIOLOGY ?I independently interpreted and visualized the RUQ [place]. My interpretation: No gallstones ?Radiology interpretation:  ?IMPRESSION:  ?Incidental subcentimeter gallbladder polyps. No other acute finding  ?by ultrasound.  ?   ? ? ?PROCEDURES: ? ?Critical Care performed: No ? ?Procedures ? ? ?MEDICATIONS ORDERED IN ED: ?Medications - No data to display ? ? ?IMPRESSION / MDM / ASSESSMENT AND PLAN / ED COURSE  ?I reviewed the triage vital signs and the nursing notes. ?             ?               ? ?Differential diagnosis includes, but is not limited to, pancreatitis, hepatitis, gastritis, gallbladder disease. ? ?Patient presented to the emergency department today because of concerns for epigastric pain.  It does come and go and is worse after  eating.  Mother did have history of gallbladder disease.  Ultrasound was performed today which did not show any concerning findings.  Blood work without concerning leukocytosis, elevated lipase or LFTs.  At this time I do think gastritis likely.  Discussed with patient.  Given reassuring work-up.  Do not feel patient necessitates inpatient admission at this time.  Will discharge with antacids and dietary guidelines. ? ? ?FINAL CLINICAL IMPRESSION(S) / ED DIAGNOSES  ? ?Final diagnoses:  ?Epigastric pain  ? ? ? ?Note:  This document was prepared using Dragon voice recognition software and may include unintentional dictation errors. ? ?  ?Nance Pear, MD ?10/28/21 1920 ? ?

## 2021-10-28 NOTE — Discharge Instructions (Signed)
Please seek medical attention for any high fevers, chest pain, shortness of breath, change in behavior, persistent vomiting, bloody stool or any other new or concerning symptoms.  

## 2021-11-22 ENCOUNTER — Other Ambulatory Visit: Payer: Self-pay | Admitting: Family

## 2021-11-22 DIAGNOSIS — Z3041 Encounter for surveillance of contraceptive pills: Secondary | ICD-10-CM

## 2021-11-25 ENCOUNTER — Ambulatory Visit: Payer: BC Managed Care – PPO | Admitting: Nurse Practitioner

## 2021-11-25 ENCOUNTER — Encounter: Payer: Self-pay | Admitting: Nurse Practitioner

## 2021-11-25 VITALS — BP 110/78 | HR 71 | Temp 97.5°F | Resp 10 | Ht 63.0 in | Wt 133.1 lb

## 2021-11-25 DIAGNOSIS — Z0000 Encounter for general adult medical examination without abnormal findings: Secondary | ICD-10-CM | POA: Insufficient documentation

## 2021-11-25 DIAGNOSIS — Z3041 Encounter for surveillance of contraceptive pills: Secondary | ICD-10-CM | POA: Diagnosis not present

## 2021-11-25 DIAGNOSIS — F411 Generalized anxiety disorder: Secondary | ICD-10-CM

## 2021-11-25 LAB — COMPREHENSIVE METABOLIC PANEL
ALT: 8 U/L (ref 0–35)
AST: 14 U/L (ref 0–37)
Albumin: 4.5 g/dL (ref 3.5–5.2)
Alkaline Phosphatase: 38 U/L — ABNORMAL LOW (ref 39–117)
BUN: 9 mg/dL (ref 6–23)
CO2: 30 mEq/L (ref 19–32)
Calcium: 9.4 mg/dL (ref 8.4–10.5)
Chloride: 100 mEq/L (ref 96–112)
Creatinine, Ser: 0.74 mg/dL (ref 0.40–1.20)
GFR: 114.08 mL/min (ref >60.00)
Glucose, Bld: 91 mg/dL (ref 70–99)
Potassium: 4.2 mEq/L (ref 3.5–5.1)
Sodium: 137 mEq/L (ref 135–145)
Total Bilirubin: 0.6 mg/dL (ref 0.2–1.2)
Total Protein: 7 g/dL (ref 6.0–8.3)

## 2021-11-25 LAB — LIPID PANEL
Cholesterol: 182 mg/dL (ref 0–200)
HDL: 64.7 mg/dL (ref >39.00)
LDL Cholesterol: 104 mg/dL — ABNORMAL HIGH (ref 0–99)
NonHDL: 117.09
Total CHOL/HDL Ratio: 3
Triglycerides: 63 mg/dL (ref 0.0–149.0)
VLDL: 12.6 mg/dL (ref 0.0–40.0)

## 2021-11-25 LAB — CBC WITH DIFFERENTIAL/PLATELET
Basophils Absolute: 0 10*3/uL (ref 0.0–0.1)
Basophils Relative: 0.7 % (ref 0.0–3.0)
Eosinophils Absolute: 0.2 10*3/uL (ref 0.0–0.7)
Eosinophils Relative: 2.8 % (ref 0.0–5.0)
HCT: 40.4 % (ref 36.0–46.0)
Hemoglobin: 13.7 g/dL (ref 12.0–15.0)
Lymphocytes Relative: 39.2 % (ref 12.0–46.0)
Lymphs Abs: 2.4 10*3/uL (ref 0.7–4.0)
MCHC: 33.9 g/dL (ref 30.0–36.0)
MCV: 91.4 fl (ref 78.0–100.0)
Monocytes Absolute: 0.4 10*3/uL (ref 0.1–1.0)
Monocytes Relative: 6 % (ref 3.0–12.0)
Neutro Abs: 3.2 10*3/uL (ref 1.4–7.7)
Neutrophils Relative %: 51.3 % (ref 43.0–77.0)
Platelets: 342 10*3/uL (ref 150.0–400.0)
RBC: 4.42 Mil/uL (ref 3.87–5.11)
RDW: 12.6 % (ref 11.5–15.5)
WBC: 6.1 10*3/uL (ref 4.0–10.5)

## 2021-11-25 LAB — TSH: TSH: 2.1 u[IU]/mL (ref 0.35–5.50)

## 2021-11-25 LAB — POCT URINE PREGNANCY: Preg Test, Ur: NEGATIVE

## 2021-11-25 MED ORDER — NORGESTIMATE-ETH ESTRADIOL 0.25-35 MG-MCG PO TABS: 1.0000 | ORAL_TABLET | Freq: Every day | ORAL | 3 refills | Status: DC

## 2021-11-25 NOTE — Assessment & Plan Note (Signed)
HQ 9 GAD-7 administered in office.  Patient denies HI/SI/AVH.  Patient currently in therapy approximately twice a month.  Patient states is going well.  It is beneficial.  Continue therapy for now, defer pharmacological management at this point time. ?

## 2021-11-25 NOTE — Assessment & Plan Note (Signed)
Patient maintained on birth control, tolerates it well.  States she has been out of it for approximately 3 days.  U pregnant at office.  Did instruct patient that when she starts back she is to have a backup form of contraception for at least 7 days.  Patient states periods are well regulated and not overly painful ?

## 2021-11-25 NOTE — Progress Notes (Signed)
? ?Established Patient Office Visit ? ?Subjective   ?Patient ID: Nancy Fleming, female    DOB: [birth year]  Age: 26 y.o. MRN: [id number] ? ?Chief Complaint  ?Patient presents with  ? Transfer of Care  ? ? ?HPI ? ?for complete physical and follow up of chronic conditions. ? ?Immunizations: ?-Tetanus: 2021 ?-Influenza: normally get iy. Currently out of season ?-Covid-19: Radiographer, therapeutic x2 ?-Shingles: NA ?-Pneumonia: NA ? ?-HPV: up to date ? ?Diet: Fair diet. Will have 3 meals a day with the first 2 smaller meals and a larger dinner. Will have a snack after lunch. Coffee and water ?Exercise:  Pilates 4 days a week for approx 20-30 mins ? ?Eye exam: Has glasses for far away sight. Will use at night with driving. Over a year ?Dental exam: Completes semi-annually  ? ?Pap Smear: Completed in 2021 ?Mammogram: NA  ?Colonoscopy: NA ?Dexa: NA ? ? ?Lung Cancer Screening: NA ? ?Sleep: Will go to bed 930-10 and wake up at 545a. Feels rested most of the time. Do not think she snores ? ?Anxiety: sees a therapist twice a month. Patient states it is more for anxiety.  Currently states she feels like she is doing okay off of medication.  We did discuss if the need of medication arises we can always start with a as needed medication such as hydroxyzine. ? ? ? ?Review of Systems  ?Constitutional:  Negative for chills, fever and malaise/fatigue.  ?Respiratory:  Negative for shortness of breath and wheezing.   ?Cardiovascular:  Positive for palpitations (anxiety noticed this year.). Negative for chest pain and leg swelling.  ?Gastrointestinal:  Negative for abdominal pain, constipation, nausea and vomiting.  ?     BM daily ?  ?Genitourinary:  Negative for dysuria and hematuria.  ?     Nocturia intermittent   ?Neurological:  Positive for headaches (will abate). Negative for dizziness, tingling and weakness.  ?Psychiatric/Behavioral:  Negative for hallucinations and suicidal ideas.   ? ?  ?Objective:  ?  ? ?BP 110/78   Pulse 71   Temp (!) 97.5  ?F (36.4 ?C)   Resp 10   Ht 5\' 3"  (1.6 m)   Wt 133 lb 2 oz (60.4 kg)   LMP 11/18/2021   SpO2 98%   BMI 23.58 kg/m?  ? ? ?Physical Exam ?Vitals and nursing note reviewed.  ?Constitutional:   ?   Appearance: Normal appearance.  ?HENT:  ?   Right Ear: Tympanic membrane, ear canal and external ear normal.  ?   Left Ear: Tympanic membrane, ear canal and external ear normal.  ?   Mouth/Throat:  ?   Mouth: Mucous membranes are moist.  ?   Pharynx: Oropharynx is clear.  ?Eyes:  ?   Extraocular Movements: Extraocular movements intact.  ?   Pupils: Pupils are equal, round, and reactive to light.  ?Cardiovascular:  ?   Rate and Rhythm: Normal rate and regular rhythm.  ?   Pulses: Normal pulses.  ?   Heart sounds: Normal heart sounds.  ?Pulmonary:  ?   Effort: Pulmonary effort is normal.  ?   Breath sounds: Normal breath sounds.  ?Abdominal:  ?   General: Bowel sounds are normal. There is no distension.  ?   Palpations: There is no mass.  ?   Tenderness: There is no abdominal tenderness.  ?   Hernia: No hernia is present.  ?Musculoskeletal:  ?   Right lower leg: No edema.  ?   Left lower leg: No edema.  ?  Lymphadenopathy:  ?   Cervical: No cervical adenopathy.  ?Skin: ?   General: Skin is warm.  ?Neurological:  ?   General: No focal deficit present.  ?   Mental Status: She is alert.  ?Psychiatric:     ?   Mood and Affect: Mood normal.     ?   Behavior: Behavior normal.     ?   Thought Content: Thought content normal.     ?   Judgment: Judgment normal.  ? ? ? ?Results for orders placed or performed in visit on 11/25/21  ?POCT urine pregnancy  ?Result Value Ref Range  ? Preg Test, Ur Negative Negative  ? ? ? ? ?The ASCVD Risk score (Arnett DK, et al., 2019) failed to calculate for the following reasons: ?  The 2019 ASCVD risk score is only valid for ages 6 to 68 ? ?  ?Assessment & Plan:  ? ?Problem List Items Addressed This Visit   ? ?  ? Other  ? Encounter for surveillance of contraceptive pills  ?  Patient maintained on  birth control, tolerates it well.  States she has been out of it for approximately 3 days.  U pregnant at office.  Did instruct patient that when she starts back she is to have a backup form of contraception for at least 7 days.  Patient states periods are well regulated and not overly painful ? ?  ?  ? Relevant Medications  ? norgestimate-ethinyl estradiol (SPRINTEC 28) 0.25-35 MG-MCG tablet  ? Other Relevant Orders  ? POCT urine pregnancy (Completed)  ? Preventative health care - Primary  ?  Discussed age-appropriate immunizations and screening exams. ? ?  ?  ? Relevant Orders  ? CBC with Differential/Platelet  ? Comprehensive metabolic panel  ? Lipid panel  ? TSH  ? GAD (generalized anxiety disorder)  ?  HQ 9 GAD-7 administered in office.  Patient denies HI/SI/AVH.  Patient currently in therapy approximately twice a month.  Patient states is going well.  It is beneficial.  Continue therapy for now, defer pharmacological management at this point time. ? ?  ?  ? Relevant Orders  ? TSH  ? ? ?No follow-ups on file.  ? ? ?Audria Nine, NP ? ?

## 2021-11-25 NOTE — Patient Instructions (Signed)
Nice to see you today ?I will be in touch with the labs once I have them ?I sent in a birth control for a year ?Follow up with me in 1 year, sooner if you need me ? ?

## 2021-11-25 NOTE — Assessment & Plan Note (Signed)
Discussed age-appropriate immunizations and screening exams. 

## 2022-02-18 ENCOUNTER — Telehealth: Payer: BC Managed Care – PPO | Admitting: Emergency Medicine

## 2022-02-18 DIAGNOSIS — L309 Dermatitis, unspecified: Secondary | ICD-10-CM

## 2022-02-18 MED ORDER — PREDNISONE 20 MG PO TABS: 40.0000 mg | ORAL_TABLET | Freq: Every day | ORAL | 0 refills | Status: DC

## 2022-02-18 NOTE — Progress Notes (Signed)
Virtual Visit Consent   [name], you are scheduled for a virtual visit with a [facility] provider today. Just as with appointments in the office, your consent must be obtained to participate. Your consent will be active for this visit and any virtual visit you may have with one of our providers in the next 365 days. If you have a MyChart account, a copy of this consent can be sent to you electronically.  As this is a virtual visit, video technology does not allow for your provider to perform a traditional examination. This may limit your provider's ability to fully assess your condition. If your provider identifies any concerns that need to be evaluated in person or the need to arrange testing (such as labs, EKG, etc.), we will make arrangements to do so. Although advances in technology are sophisticated, we cannot ensure that it will always work on either your end or our end. If the connection with a video visit is poor, the visit may have to be switched to a telephone visit. With either a video or telephone visit, we are not always able to ensure that we have a secure connection.  By engaging in this virtual visit, you consent to the provision of healthcare and authorize for your insurance to be billed (if applicable) for the services provided during this visit. Depending on your insurance coverage, you may receive a charge related to this service.  I need to obtain your verbal consent now. Are you willing to proceed with your visit today? Nancy Fleming has provided verbal consent on 02/18/2022 for a virtual visit (video or telephone). Roxy Horseman, PA-C  Date: 02/18/2022 11:42 AM  Virtual Visit via Video Note   I, Roxy Horseman, connected with  Nancy Fleming  ([id number], [birth year]) on 02/18/22 at 11:45 AM EDT by a video-enabled telemedicine application and verified that I am speaking with the correct person using two identifiers.  Location: Patient: Virtual Visit Location  Patient: Home Provider: Virtual Visit Location Provider: Home Office   I discussed the limitations of evaluation and management by telemedicine and the availability of in person appointments. The patient expressed understanding and agreed to proceed.    History of Present Illness: Nancy Fleming is a 26 y.o. who identifies as a female who was assigned female at birth, and is being seen today for rash on the back of her neck.  Report hx of eczema.  Reports that it itches.  Denies fever.  States that it has been present for a couple of months.  HPI: HPI  Problems:  Patient Active Problem List   Diagnosis Date Noted   Encounter for surveillance of contraceptive pills 11/25/2021   Preventative health care 11/25/2021   GAD (generalized anxiety disorder) 11/25/2021   Nasal sinus congestion 01/14/2021   Right leg pain 10/04/2013    Allergies:  Allergies  Allergen Reactions   Cefdinir Hives   Penicillins    Medications:  Current Outpatient Medications:    famotidine (PEPCID) 20 MG tablet, Take 1 tablet (20 mg total) by mouth daily., Disp: 30 tablet, Rfl: 1   norgestimate-ethinyl estradiol (SPRINTEC 28) 0.25-35 MG-MCG tablet, Take 1 tablet by mouth daily., Disp: 84 tablet, Rfl: 3   triamcinolone (KENALOG) 0.025 % cream, Apply 1 application topically as needed., Disp: , Rfl:   Observations/Objective: Patient is well-developed, well-nourished in no acute distress.  Resting comfortably at home.  Head is normocephalic, atraumatic.  No labored breathing.  Speech is clear and coherent with logical content.  Patient  is alert and oriented at baseline.  Scaly rash on back of neck that on video appears consistent with eczema exacerbation  Assessment and Plan: 1. Exacerbation of eczema  - PO prednisone for 5 days - Kenalog ointment  We discussed that if this treatment doesn't work, then fungal infection should be considered and she will follow-up with her PCP for urgent care for  evaluation.  Follow Up Instructions: I discussed the assessment and treatment plan with the patient. The patient was provided an opportunity to ask questions and all were answered. The patient agreed with the plan and demonstrated an understanding of the instructions.  A copy of instructions were sent to the patient via MyChart unless otherwise noted below.     The patient was advised to call back or seek an in-person evaluation if the symptoms worsen or if the condition fails to improve as anticipated.  Time:  I spent 10 minutes with the patient via telehealth technology discussing the above problems/concerns.    Roxy Horseman, PA-C

## 2022-09-11 IMAGING — US US ABDOMEN LIMITED
1 series · 15 of 25 positions shown · non-contrast
Comparison: 07/15/2009

CLINICAL DATA: Epigastric abdominal pain for 3 days

EXAM:
ULTRASOUND ABDOMEN LIMITED RIGHT UPPER QUADRANT

[Series 1: us abdomen limited ruq · 15 of 40 slices shown]
[im 1/40]
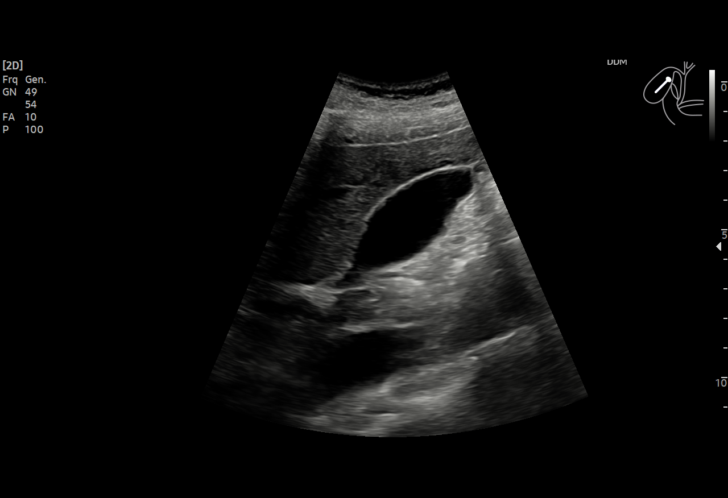
[im 4/40]
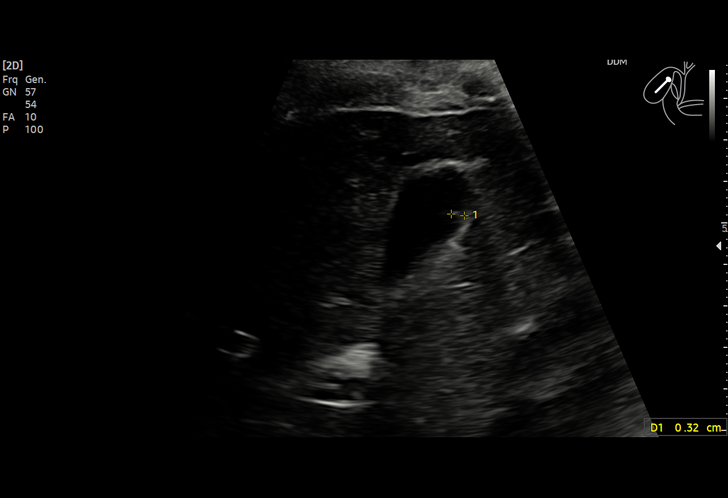
[im 7/40]
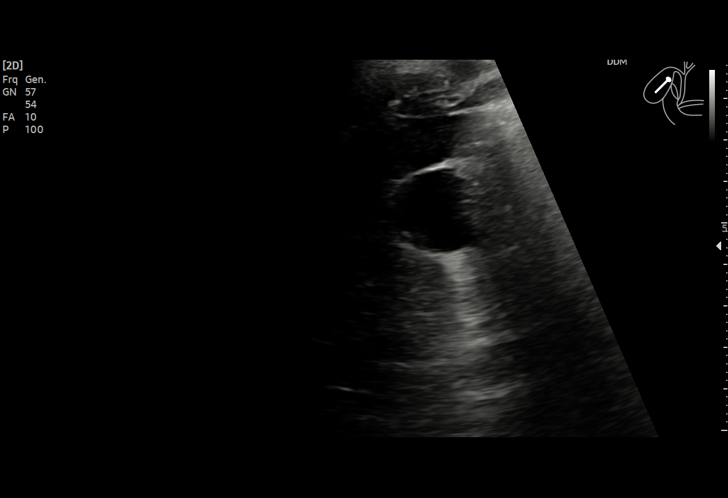
[im 9/40]
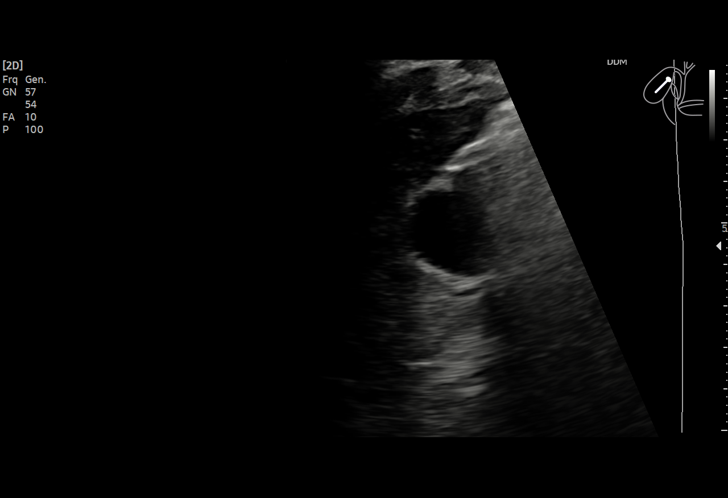
[im 12/40]
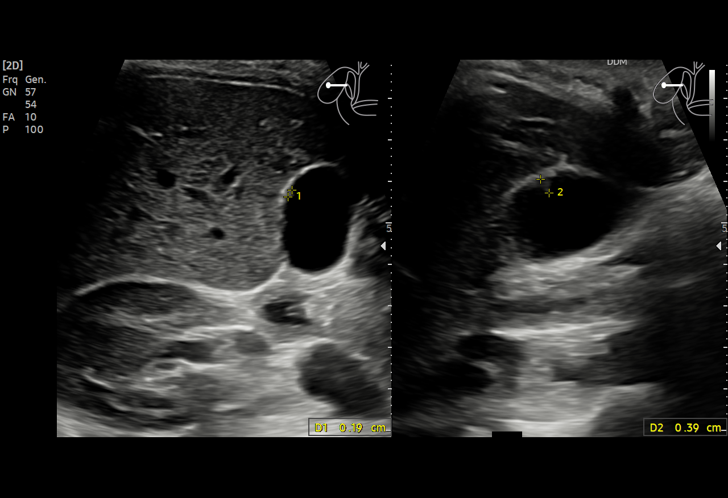
[im 15/40]
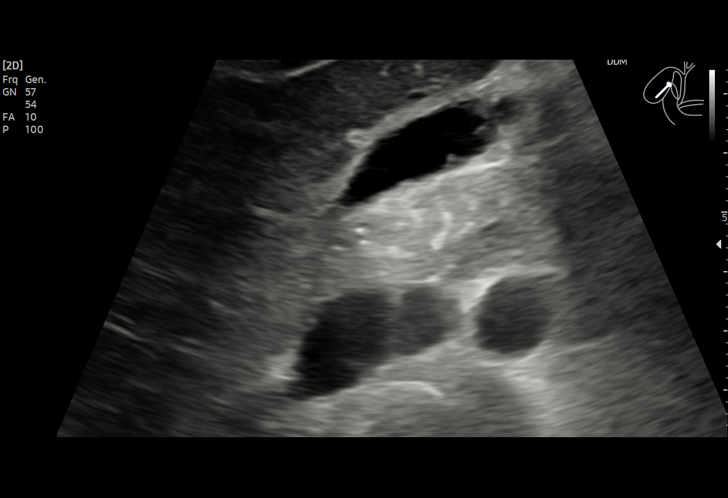
[im 17/40]
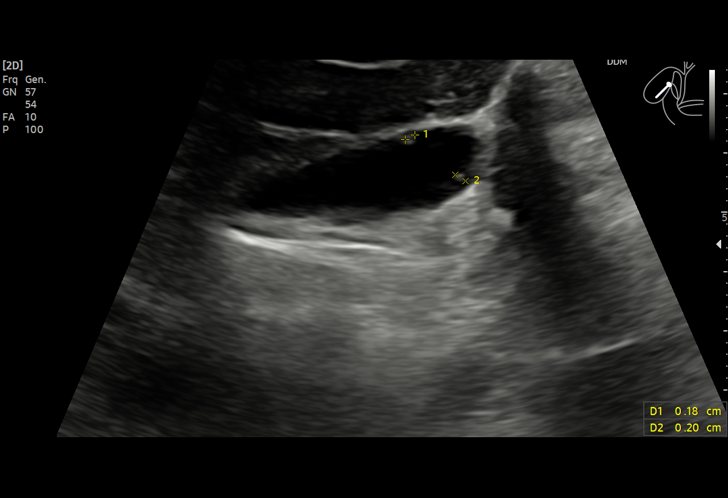
[im 20/40]
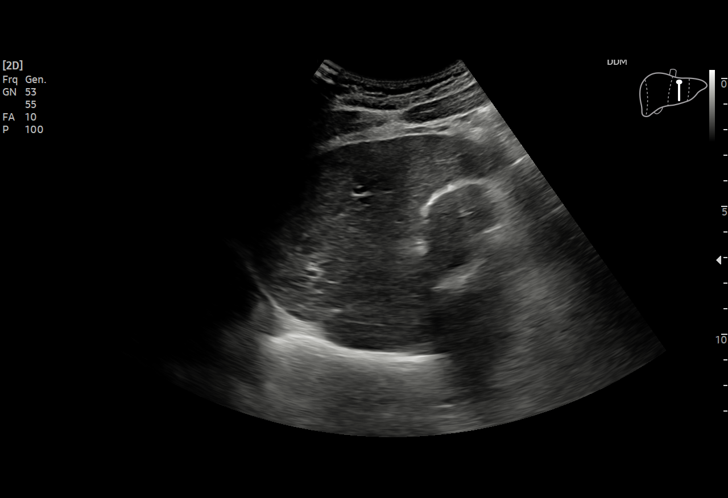
[im 23/40]
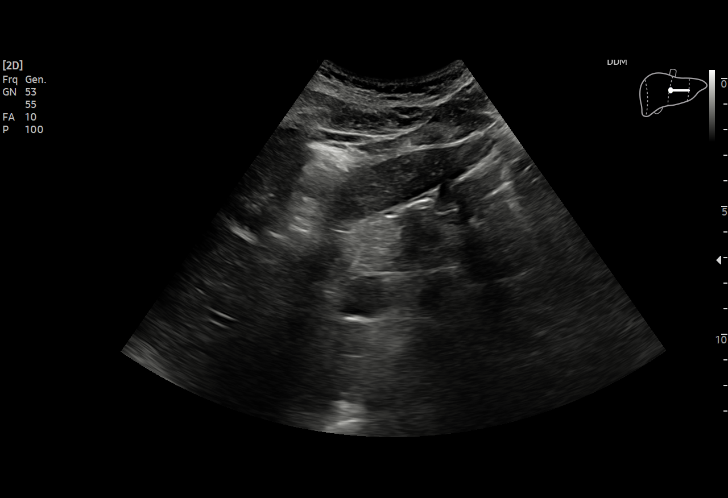
[im 25/40]
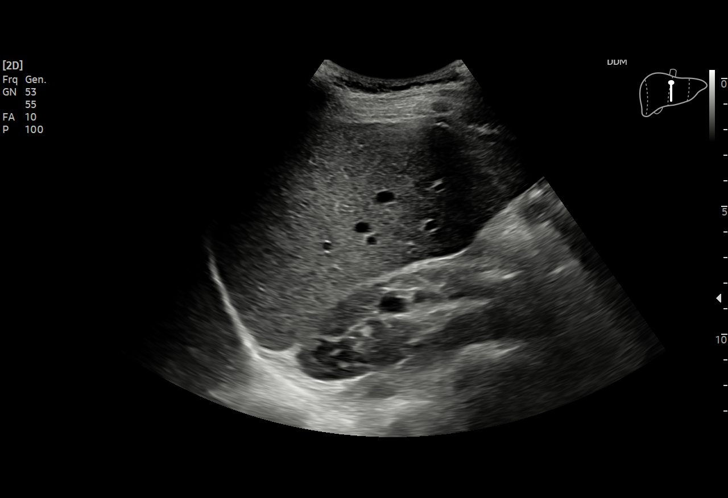
[im 28/40]
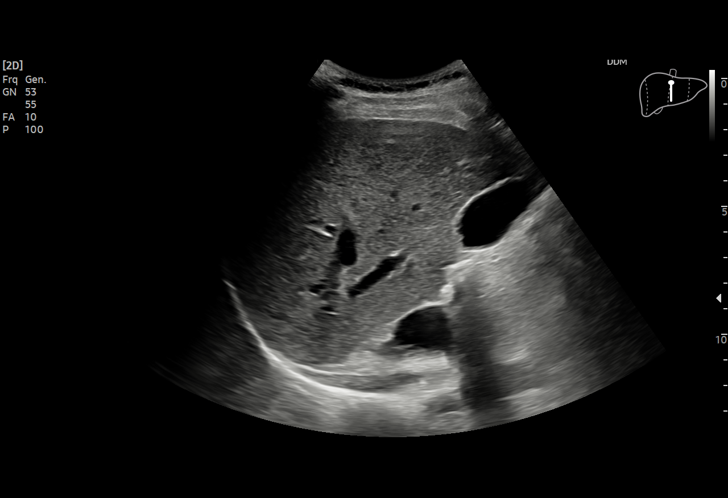
[im 31/40]
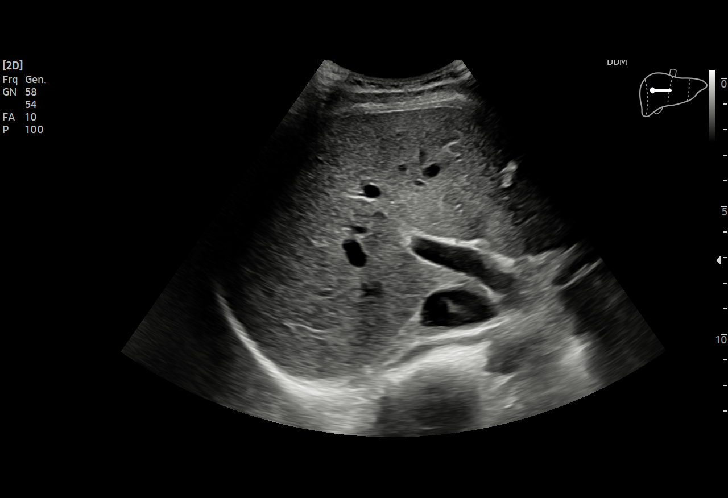
[im 33/40]
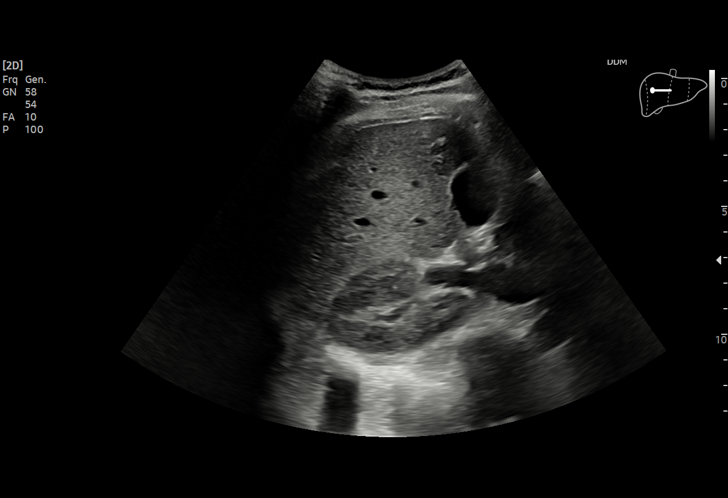
[im 36/40]
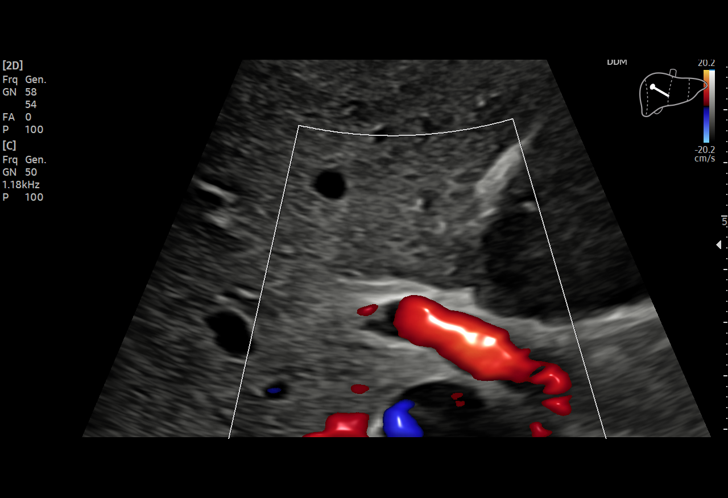
[im 40/40]
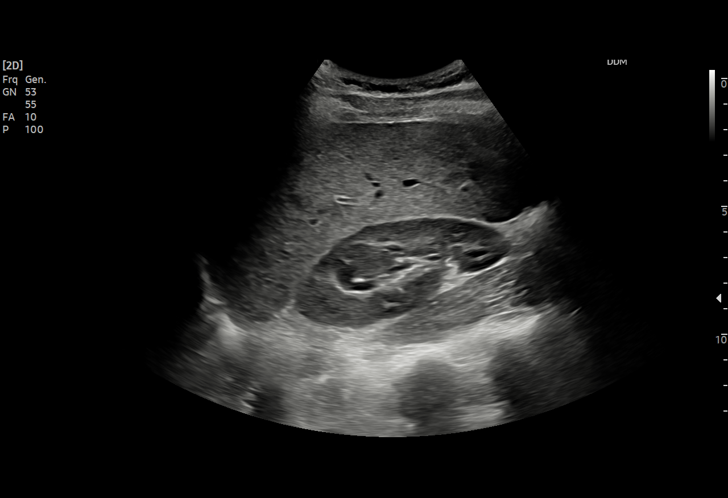

[15 of 25 positions shown; findings below may reference images not displayed]

FINDINGS: Gallbladder:

Incidental subcentimeter hypoechoic gallbladder polyps noted all
measuring 4 mm or less. Negative for gallstones. No wall thickening
or pericholecystic fluid. No Murphy's sign elicited.

Common bile duct:

Diameter: 1.6 mm

Liver:

No focal lesion identified. Within normal limits in parenchymal
echogenicity. Portal vein is patent on color Doppler imaging with
normal direction of blood flow towards the liver.

Other: Included views of the right kidney demonstrate no
hydronephrosis. No upper abdominal ascites.
IMPRESSION: Incidental subcentimeter gallbladder polyps. No other acute finding
by ultrasound.

## 2022-10-22 ENCOUNTER — Other Ambulatory Visit: Payer: Self-pay | Admitting: Nurse Practitioner

## 2022-10-22 DIAGNOSIS — Z3041 Encounter for surveillance of contraceptive pills: Secondary | ICD-10-CM

## 2022-10-24 ENCOUNTER — Encounter: Payer: Self-pay | Admitting: Nurse Practitioner

## 2022-10-25 ENCOUNTER — Other Ambulatory Visit: Payer: Self-pay

## 2022-10-25 ENCOUNTER — Telehealth: Payer: Self-pay | Admitting: Nurse Practitioner

## 2022-10-25 DIAGNOSIS — Z3041 Encounter for surveillance of contraceptive pills: Secondary | ICD-10-CM

## 2022-10-25 MED ORDER — NORGESTIMATE-ETH ESTRADIOL 0.25-35 MG-MCG PO TABS: 1.0000 | ORAL_TABLET | Freq: Every day | ORAL | 0 refills | Status: DC

## 2022-10-25 NOTE — Telephone Encounter (Signed)
Scheduled cpe for 01/21/23

## 2022-10-25 NOTE — Telephone Encounter (Signed)
norgestimate-ethinyl estradiol (SPRINTEC 28) 0.25-35 MG-MCG tablet  Last visit 11/25/2021 Next No follow up on file

## 2022-10-25 NOTE — Telephone Encounter (Signed)
Prescription Request  10/25/2022  LOV: 11/25/2021  What is the name of the medication or equipment? norgestimate-ethinyl estradiol ([place] 28) 0.25-35 MG-MCG tablet   Have you contacted your pharmacy to request a refill? No   Which pharmacy would you like this sent to?  [id number] [address], [place] [address] Phone: [phone] Fax: [phone]    Patient notified that their request is being sent to the clinical staff for review and that they should receive a response within 2 business days.   Please advise at Mobile [phone] (mobile)

## 2022-10-25 NOTE — Telephone Encounter (Signed)
She was speaking with someone via mychart regarding appt, nothing is scheduled so they may not have seen the message yet. will call back to sched her

## 2022-10-25 NOTE — Telephone Encounter (Signed)
Forwarded to provider.

## 2022-10-25 NOTE — Telephone Encounter (Signed)
RX request sent to provider.

## 2022-10-25 NOTE — Telephone Encounter (Signed)
Lvmtcb, sent mychart message  

## 2023-01-05 ENCOUNTER — Other Ambulatory Visit: Payer: Self-pay | Admitting: Nurse Practitioner

## 2023-01-05 DIAGNOSIS — Z3041 Encounter for surveillance of contraceptive pills: Secondary | ICD-10-CM

## 2023-01-06 ENCOUNTER — Other Ambulatory Visit: Payer: Self-pay | Admitting: Nurse Practitioner

## 2023-01-06 DIAGNOSIS — Z3041 Encounter for surveillance of contraceptive pills: Secondary | ICD-10-CM

## 2023-01-06 MED ORDER — NORGESTIMATE-ETH ESTRADIOL 0.25-35 MG-MCG PO TABS: 1.0000 | ORAL_TABLET | Freq: Every day | ORAL | 0 refills | Status: DC

## 2023-01-21 ENCOUNTER — Other Ambulatory Visit (HOSPITAL_COMMUNITY)
Admission: RE | Admit: 2023-01-21 | Discharge: 2023-01-21 | Disposition: A | Discharge status: Home / Self Care | Payer: BC Managed Care – PPO | Source: Ambulatory Visit | Attending: Nurse Practitioner | Admitting: Nurse Practitioner

## 2023-01-21 ENCOUNTER — Ambulatory Visit (INDEPENDENT_AMBULATORY_CARE_PROVIDER_SITE_OTHER): Payer: BC Managed Care – PPO | Admitting: Nurse Practitioner

## 2023-01-21 ENCOUNTER — Encounter: Payer: Self-pay | Admitting: Nurse Practitioner

## 2023-01-21 VITALS — BP 102/68 | HR 75 | Temp 98.1°F | Resp 16 | Ht 63.5 in | Wt 127.1 lb

## 2023-01-21 DIAGNOSIS — Z124 Encounter for screening for malignant neoplasm of cervix: Secondary | ICD-10-CM | POA: Insufficient documentation

## 2023-01-21 DIAGNOSIS — Z3041 Encounter for surveillance of contraceptive pills: Secondary | ICD-10-CM

## 2023-01-21 DIAGNOSIS — Z0000 Encounter for general adult medical examination without abnormal findings: Secondary | ICD-10-CM | POA: Diagnosis not present

## 2023-01-21 LAB — COMPREHENSIVE METABOLIC PANEL
ALT: 12 U/L (ref 0–35)
AST: 15 U/L (ref 0–37)
Albumin: 4.2 g/dL (ref 3.5–5.2)
Alkaline Phosphatase: 29 U/L — ABNORMAL LOW (ref 39–117)
BUN: 13 mg/dL (ref 6–23)
CO2: 27 mEq/L (ref 19–32)
Calcium: 9.4 mg/dL (ref 8.4–10.5)
Chloride: 104 mEq/L (ref 96–112)
Creatinine, Ser: 0.76 mg/dL (ref 0.40–1.20)
GFR: 109.59 mL/min (ref >60.00)
Glucose, Bld: 89 mg/dL (ref 70–99)
Potassium: 4.1 mEq/L (ref 3.5–5.1)
Sodium: 138 mEq/L (ref 135–145)
Total Bilirubin: 0.8 mg/dL (ref 0.2–1.2)
Total Protein: 6.6 g/dL (ref 6.0–8.3)

## 2023-01-21 LAB — CBC
HCT: 41.1 % (ref 36.0–46.0)
Hemoglobin: 13.5 g/dL (ref 12.0–15.0)
MCHC: 32.7 g/dL (ref 30.0–36.0)
MCV: 93 fl (ref 78.0–100.0)
Platelets: 309 10*3/uL (ref 150.0–400.0)
RBC: 4.42 Mil/uL (ref 3.87–5.11)
RDW: 13.1 % (ref 11.5–15.5)
WBC: 4.4 10*3/uL (ref 4.0–10.5)

## 2023-01-21 LAB — TSH: TSH: 4.01 u[IU]/mL (ref 0.35–5.50)

## 2023-01-21 MED ORDER — NORGESTIMATE-ETH ESTRADIOL 0.25-35 MG-MCG PO TABS: 1.0000 | ORAL_TABLET | Freq: Every day | ORAL | 3 refills | Status: DC

## 2023-01-21 NOTE — Assessment & Plan Note (Signed)
Discussed age-appropriate immunizations and screening exams.  Reviewed patient's personal, surgical, social, family histories.  Patient is up-to-date on all age-appropriate vaccinations.  Patient is too young for CRC screening or breast cancer screening.  Cervical cancer screening completed today with a Pap smear and on STI testing.  Patient was given information at discharge about preventative healthcare maintenance with anticipatory guidance.

## 2023-01-21 NOTE — Assessment & Plan Note (Signed)
Deferred pregnancy test today as patient is currently on her period.  Refill of birth control done today

## 2023-01-21 NOTE — Patient Instructions (Signed)
Nice to see you today I will be in touch with the labs once I have them Follow up with me in 1 year, sooner if you need me   

## 2023-01-21 NOTE — Progress Notes (Signed)
Established Patient Office Visit  Subjective   Patient ID: Nancy Fleming, female    DOB: Nov 05, 1995  Age: 27 y.o. MRN: [id number]  Chief Complaint  Patient presents with   Annual Exam    HPI  for complete physical and follow up of chronic conditions.  Immunizations: -Tetanus: Completed in 2021 -Influenza: Out of season  -Shingles: too young -Pneumonia: too young  -HPV: up to date -covid: pfizer   Diet: Fair diet. States that she is doing 3 meals and a snack. States that coffee in the am and then water Exercise: No regular exercise. Pilates and weight training. 6 days a week state aroun 30-45 mins   Eye exam:  PRN  Dental exam: Completes semi-annually    Colonoscopy: Completed in  Lung Cancer Screening: Completed in   Pap smear: done in 2021 will need repeat today   Mammogram: too young   Dexa: too young  Sleep: states that while working she will go to bed aroun 10 and get up aroun 545. When off 11-8-9. Feels rested and does not snore   Would lke STI testing        Review of Systems  Constitutional:  Negative for chills and fever.  Respiratory:  Negative for shortness of breath.   Cardiovascular:  Negative for chest pain and leg swelling.  Gastrointestinal:  Negative for abdominal pain, blood in stool, constipation, diarrhea, nausea and vomiting.       Bm daily   Genitourinary:  Negative for dysuria and hematuria.  Neurological:  Negative for tingling and headaches.  Psychiatric/Behavioral:  Negative for hallucinations and suicidal ideas.       Objective:     BP 102/68   Pulse 75   Temp 98.1 F (36.7 C)   Resp 16   Ht 5' 3.5" (1.613 m)   Wt 127 lb 2 oz (57.7 kg)   LMP 01/17/2023   SpO2 98%   BMI 22.17 kg/m  BP Readings from Last 3 Encounters:  01/21/23 102/68  11/25/21 110/78  10/28/21 116/76   Wt Readings from Last 3 Encounters:  01/21/23 127 lb 2 oz (57.7 kg)  11/25/21 133 lb 2 oz (60.4 kg)  10/28/21 125 lb (56.7 kg)       Physical Exam Vitals and nursing note reviewed. Exam conducted with a chaperone present Tresa Endo Fiscal, CMA).  Constitutional:      Appearance: Normal appearance.  HENT:     Right Ear: Tympanic membrane, ear canal and external ear normal.     Left Ear: Tympanic membrane, ear canal and external ear normal.     Mouth/Throat:     Mouth: Mucous membranes are moist.     Pharynx: Oropharynx is clear.  Eyes:     Extraocular Movements: Extraocular movements intact.     Pupils: Pupils are equal, round, and reactive to light.  Cardiovascular:     Rate and Rhythm: Normal rate and regular rhythm.     Pulses: Normal pulses.     Heart sounds: Normal heart sounds.  Pulmonary:     Effort: Pulmonary effort is normal.     Breath sounds: Normal breath sounds.  Abdominal:     General: Bowel sounds are normal. There is no distension.     Palpations: There is no mass.     Tenderness: There is no abdominal tenderness.     Hernia: No hernia is present.  Musculoskeletal:     Right lower leg: No edema.     Left lower leg: No  edema.  Lymphadenopathy:     Cervical: No cervical adenopathy.  Skin:    General: Skin is warm.  Neurological:     General: No focal deficit present.     Mental Status: She is alert.     Deep Tendon Reflexes:     Reflex Scores:      Bicep reflexes are 2+ on the right side and 2+ on the left side.      Patellar reflexes are 2+ on the right side and 2+ on the left side.    Comments: Bilateral upper and lower extremity strength 5/5  Psychiatric:        Mood and Affect: Mood normal.        Behavior: Behavior normal.        Thought Content: Thought content normal.        Judgment: Judgment normal.      No results found for any visits on 01/21/23.    The ASCVD Risk score (Arnett DK, et al., 2019) failed to calculate for the following reasons:   The 2019 ASCVD risk score is only valid for ages 19 to 33    Assessment & Plan:   Problem List Items Addressed This Visit        Other   Encounter for surveillance of contraceptive pills    Deferred pregnancy test today as patient is currently on her period.  Refill of birth control done today      Relevant Medications   norgestimate-ethinyl estradiol (SPRINTEC 28) 0.25-35 MG-MCG tablet   Preventative health care - Primary    Discussed age-appropriate immunizations and screening exams.  Reviewed patient's personal, surgical, social, family histories.  Patient is up-to-date on all age-appropriate vaccinations.  Patient is too young for CRC screening or breast cancer screening.  Cervical cancer screening completed today with a Pap smear and on STI testing.  Patient was given information at discharge about preventative healthcare maintenance with anticipatory guidance.      Relevant Orders   CBC   Comprehensive metabolic panel   TSH   Other Visit Diagnoses     Cervical cancer screening       Relevant Orders   Cytology - PAP([facility])       Return in about 1 year (around 01/21/2024) for CPE and Labs.    Audria Nine, NP

## 2023-01-26 LAB — CYTOLOGY - PAP
Chlamydia: NEGATIVE
Comment: NEGATIVE
Comment: NEGATIVE
Comment: NEGATIVE
Comment: NEGATIVE
Comment: NORMAL
Diagnosis: NEGATIVE
HSV1: NEGATIVE
HSV2: NEGATIVE
High risk HPV: NEGATIVE
Neisseria Gonorrhea: NEGATIVE
Trichomonas: NEGATIVE

## 2024-01-06 ENCOUNTER — Encounter: Payer: Self-pay | Admitting: Nurse Practitioner

## 2024-01-06 ENCOUNTER — Other Ambulatory Visit: Payer: Self-pay | Admitting: Nurse Practitioner

## 2024-01-06 DIAGNOSIS — Z3041 Encounter for surveillance of contraceptive pills: Secondary | ICD-10-CM

## 2024-01-06 NOTE — Telephone Encounter (Signed)
 Can we move her appt up to no later than the end of next month please

## 2024-01-09 NOTE — Telephone Encounter (Signed)
 Vm box was full, sent mychart message

## 2024-01-26 ENCOUNTER — Encounter: Payer: Self-pay | Admitting: Nurse Practitioner

## 2024-03-29 ENCOUNTER — Other Ambulatory Visit: Payer: Self-pay | Admitting: Nurse Practitioner

## 2024-03-29 DIAGNOSIS — Z3041 Encounter for surveillance of contraceptive pills: Secondary | ICD-10-CM

## 2024-04-02 ENCOUNTER — Ambulatory Visit (INDEPENDENT_AMBULATORY_CARE_PROVIDER_SITE_OTHER): Payer: Self-pay | Admitting: Nurse Practitioner

## 2024-04-02 ENCOUNTER — Encounter: Payer: Self-pay | Admitting: Nurse Practitioner

## 2024-04-02 VITALS — BP 110/62 | HR 54 | Temp 98.1°F | Ht 63.5 in | Wt 129.0 lb

## 2024-04-02 DIAGNOSIS — E7800 Pure hypercholesterolemia, unspecified: Secondary | ICD-10-CM | POA: Diagnosis not present

## 2024-04-02 DIAGNOSIS — Z1159 Encounter for screening for other viral diseases: Secondary | ICD-10-CM | POA: Diagnosis not present

## 2024-04-02 DIAGNOSIS — Z0000 Encounter for general adult medical examination without abnormal findings: Secondary | ICD-10-CM

## 2024-04-02 DIAGNOSIS — Z3041 Encounter for surveillance of contraceptive pills: Secondary | ICD-10-CM

## 2024-04-02 LAB — COMPREHENSIVE METABOLIC PANEL WITH GFR
ALT: 11 U/L (ref 0–35)
AST: 15 U/L (ref 0–37)
Albumin: 4.8 g/dL (ref 3.5–5.2)
Alkaline Phosphatase: 33 U/L — ABNORMAL LOW (ref 39–117)
BUN: 13 mg/dL (ref 6–23)
CO2: 29 meq/L (ref 19–32)
Calcium: 10.1 mg/dL (ref 8.4–10.5)
Chloride: 101 meq/L (ref 96–112)
Creatinine, Ser: 0.87 mg/dL (ref 0.40–1.20)
GFR: 92.4 mL/min (ref >60.00)
Glucose, Bld: 89 mg/dL (ref 70–99)
Potassium: 4.3 meq/L (ref 3.5–5.1)
Sodium: 138 meq/L (ref 135–145)
Total Bilirubin: 1.1 mg/dL (ref 0.2–1.2)
Total Protein: 7.4 g/dL (ref 6.0–8.3)

## 2024-04-02 LAB — CBC WITH DIFFERENTIAL/PLATELET
Basophils Absolute: 0.1 K/uL (ref 0.0–0.1)
Basophils Relative: 1.8 % (ref 0.0–3.0)
Eosinophils Absolute: 0.1 K/uL (ref 0.0–0.7)
Eosinophils Relative: 2 % (ref 0.0–5.0)
HCT: 43.7 % (ref 36.0–46.0)
Hemoglobin: 14.6 g/dL (ref 12.0–15.0)
Lymphocytes Relative: 43.7 % (ref 12.0–46.0)
Lymphs Abs: 1.9 K/uL (ref 0.7–4.0)
MCHC: 33.5 g/dL (ref 30.0–36.0)
MCV: 92.7 fl (ref 78.0–100.0)
Monocytes Absolute: 0.3 K/uL (ref 0.1–1.0)
Monocytes Relative: 7.4 % (ref 3.0–12.0)
Neutro Abs: 1.9 K/uL (ref 1.4–7.7)
Neutrophils Relative %: 45.1 % (ref 43.0–77.0)
Platelets: 293 K/uL (ref 150.0–400.0)
RBC: 4.71 Mil/uL (ref 3.87–5.11)
RDW: 13.2 % (ref 11.5–15.5)
WBC: 4.2 K/uL (ref 4.0–10.5)

## 2024-04-02 LAB — LIPID PANEL
Cholesterol: 208 mg/dL — ABNORMAL HIGH (ref 0–200)
HDL: 81.7 mg/dL (ref >39.00)
LDL Cholesterol: 117 mg/dL — ABNORMAL HIGH (ref 0–99)
NonHDL: 126.37
Total CHOL/HDL Ratio: 3
Triglycerides: 45 mg/dL (ref 0.0–149.0)
VLDL: 9 mg/dL (ref 0.0–40.0)

## 2024-04-02 LAB — TSH: TSH: 2.4 u[IU]/mL (ref 0.35–5.50)

## 2024-04-02 MED ORDER — NORGESTIMATE-ETH ESTRADIOL 0.25-35 MG-MCG PO TABS: 1.0000 | ORAL_TABLET | Freq: Every day | ORAL | 3 refills | Status: AC

## 2024-04-02 NOTE — Assessment & Plan Note (Signed)
 Discussed age-appropriate immunizations and screening exams.  Did review patient's personal, surgical, social, family histories.  Patient is up-to-date with all age-appropriate vaccinations she would like.  Update flu vaccine today.  Patient is too young for CRC screening or breast cancer screening.  Patient is up-to-date on cervical cancer screening.  Patient was given information at discharge about preventative healthcare maintenance with anticipatory guidance.

## 2024-04-02 NOTE — Patient Instructions (Signed)
 Nice to see you today Follow up in 1 year, sooner if you need me  I will be in touch with the labs once I have reviewed them  We did update your flu vaccine in office today

## 2024-04-02 NOTE — Assessment & Plan Note (Signed)
 Has not missed any doses of birth control tolerating it well refill provided today

## 2024-04-02 NOTE — Progress Notes (Signed)
 Established Patient Office Visit  Subjective   Patient ID: Nancy Fleming, female    DOB: 1996-01-31  Age: 28 y.o. MRN: [id number]  Chief Complaint  Patient presents with   Annual Exam    Would like flu shot     HPI  for complete physical and follow up of chronic conditions.  Immunizations: -Tetanus: Completed in 2021 -Influenza:  up date today  -Shingles: Too young -Pneumonia: Too young - HPV: Up-to-date  Diet: Fair diet. 3 meals with some snacks. She will do coffee water and sparkling water  Exercise: she does weights and walking 5 times a week. For approx 45 mins   Eye exam: PRN glasses for night. Dental exam: Completes semi-annually    Colonoscopy: Too young, currently average risk Lung Cancer Screening: N/A  Pap smear: 01/21/2023 negative, due 2027  Mammogram: Too young, currently average risk  DEXA: Too young  Sleep: going to bed around 930 and will get up around 6. Feels rested        Review of Systems  Constitutional:  Negative for chills and fever.  Respiratory:  Negative for shortness of breath.   Cardiovascular:  Negative for chest pain and leg swelling.  Gastrointestinal:  Negative for abdominal pain, blood in stool, constipation, diarrhea, nausea and vomiting.       BM daily   Genitourinary:  Negative for dysuria and hematuria.  Neurological:  Negative for dizziness, tingling and headaches.  Psychiatric/Behavioral:  Negative for hallucinations and suicidal ideas.       Objective:     BP 110/62   Pulse (!) 54   Temp 98.1 F (36.7 C) (Oral)   Ht 5' 3.5 (1.613 m)   Wt 129 lb (58.5 kg)   LMP 03/26/2024 (Approximate)   SpO2 98%   BMI 22.49 kg/m  BP Readings from Last 3 Encounters:  04/02/24 110/62  01/21/23 102/68  11/25/21 110/78   Wt Readings from Last 3 Encounters:  04/02/24 129 lb (58.5 kg)  01/21/23 127 lb 2 oz (57.7 kg)  11/25/21 133 lb 2 oz (60.4 kg)   SpO2 Readings from Last 3 Encounters:  04/02/24 98%  01/21/23 98%   11/25/21 98%      Physical Exam Vitals and nursing note reviewed.  Constitutional:      Appearance: Normal appearance.  HENT:     Right Ear: Tympanic membrane, ear canal and external ear normal.     Left Ear: Tympanic membrane, ear canal and external ear normal.     Mouth/Throat:     Mouth: Mucous membranes are moist.     Pharynx: Oropharynx is clear.  Eyes:     Extraocular Movements: Extraocular movements intact.     Pupils: Pupils are equal, round, and reactive to light.  Cardiovascular:     Rate and Rhythm: Normal rate and regular rhythm.     Pulses: Normal pulses.     Heart sounds: Normal heart sounds.  Pulmonary:     Effort: Pulmonary effort is normal.     Breath sounds: Normal breath sounds.  Abdominal:     General: Bowel sounds are normal. There is no distension.     Palpations: There is no mass.     Tenderness: There is no abdominal tenderness.     Hernia: No hernia is present.  Musculoskeletal:     Right lower leg: No edema.     Left lower leg: No edema.  Lymphadenopathy:     Cervical: No cervical adenopathy.  Skin:  General: Skin is warm.  Neurological:     General: No focal deficit present.     Mental Status: She is alert.     Deep Tendon Reflexes:     Reflex Scores:      Bicep reflexes are 2+ on the right side and 2+ on the left side.      Patellar reflexes are 2+ on the right side and 2+ on the left side.    Comments: Bilateral upper and lower extremity strength 5/5  Psychiatric:        Mood and Affect: Mood normal.        Behavior: Behavior normal.        Thought Content: Thought content normal.        Judgment: Judgment normal.      No results found for any visits on 04/02/24.    The ASCVD Risk score (Arnett DK, et al., 2019) failed to calculate for the following reasons:   The 2019 ASCVD risk score is only valid for ages 19 to 59    Assessment & Plan:   Problem List Items Addressed This Visit       Other   Encounter for  surveillance of contraceptive pills   Has not missed any doses of birth control tolerating it well refill provided today      Relevant Medications   norgestimate -ethinyl estradiol  (ORTHO-CYCLEN) 0.25-35 MG-MCG tablet   Other Relevant Orders   CBC with Differential/Platelet   Comprehensive metabolic panel with GFR   Preventative health care - Primary   Discussed age-appropriate immunizations and screening exams.  Did review patient's personal, surgical, social, family histories.  Patient is up-to-date with all age-appropriate vaccinations she would like.  Update flu vaccine today.  Patient is too young for CRC screening or breast cancer screening.  Patient is up-to-date on cervical cancer screening.  Patient was given information at discharge about preventative healthcare maintenance with anticipatory guidance.      Relevant Orders   CBC with Differential/Platelet   Comprehensive metabolic panel with GFR   TSH   Elevated LDL cholesterol level   History of the same pending lipid panel today      Relevant Orders   Lipid panel   Other Visit Diagnoses       Encounter for hepatitis C screening test for low risk patient       Relevant Orders   Hepatitis C antibody       Return in about 1 year (around 04/02/2025) for CPE and Labs.    Adina Crandall, NP

## 2024-04-02 NOTE — Assessment & Plan Note (Signed)
 History of the same pending lipid panel today

## 2024-04-03 LAB — HEPATITIS C ANTIBODY: Hepatitis C Ab: NONREACTIVE

## 2024-04-05 ENCOUNTER — Ambulatory Visit: Payer: Self-pay | Admitting: Nurse Practitioner
